# Patient Record
Sex: Male | Born: 1960 | Race: White | Hispanic: No | Marital: Married | Smoking: Never smoker
Health system: Southern US, Community
[De-identification: ages and names within clinical notes are randomized; demographics above are authoritative.]

## PROBLEM LIST (undated history)

## (undated) DIAGNOSIS — I2510 Atherosclerotic heart disease of native coronary artery without angina pectoris: Secondary | ICD-10-CM

## (undated) DIAGNOSIS — E785 Hyperlipidemia, unspecified: Secondary | ICD-10-CM

## (undated) DIAGNOSIS — R519 Headache, unspecified: Secondary | ICD-10-CM

## (undated) DIAGNOSIS — I10 Essential (primary) hypertension: Secondary | ICD-10-CM

## (undated) DIAGNOSIS — M1990 Unspecified osteoarthritis, unspecified site: Secondary | ICD-10-CM

## (undated) DIAGNOSIS — R51 Headache: Secondary | ICD-10-CM

## (undated) HISTORY — PX: KNEE ARTHROSCOPY: SUR90

## (undated) HISTORY — PX: TONSILLECTOMY: SUR1361

---

## 2006-06-01 ENCOUNTER — Encounter: Admission: RE | Admit: 2006-06-01 | Discharge: 2006-06-01 | Payer: Self-pay | Admitting: Orthopedic Surgery

## 2015-12-03 DIAGNOSIS — M1712 Unilateral primary osteoarthritis, left knee: Secondary | ICD-10-CM | POA: Diagnosis not present

## 2015-12-10 DIAGNOSIS — M1712 Unilateral primary osteoarthritis, left knee: Secondary | ICD-10-CM | POA: Diagnosis not present

## 2015-12-11 DIAGNOSIS — E7800 Pure hypercholesterolemia, unspecified: Secondary | ICD-10-CM | POA: Diagnosis not present

## 2015-12-11 DIAGNOSIS — N183 Chronic kidney disease, stage 3 (moderate): Secondary | ICD-10-CM | POA: Diagnosis not present

## 2015-12-11 DIAGNOSIS — R739 Hyperglycemia, unspecified: Secondary | ICD-10-CM | POA: Diagnosis not present

## 2015-12-11 DIAGNOSIS — I10 Essential (primary) hypertension: Secondary | ICD-10-CM | POA: Diagnosis not present

## 2015-12-17 DIAGNOSIS — M1712 Unilateral primary osteoarthritis, left knee: Secondary | ICD-10-CM | POA: Diagnosis not present

## 2015-12-18 DIAGNOSIS — N183 Chronic kidney disease, stage 3 (moderate): Secondary | ICD-10-CM | POA: Diagnosis not present

## 2015-12-18 DIAGNOSIS — M1712 Unilateral primary osteoarthritis, left knee: Secondary | ICD-10-CM | POA: Diagnosis not present

## 2015-12-18 DIAGNOSIS — I10 Essential (primary) hypertension: Secondary | ICD-10-CM | POA: Diagnosis not present

## 2015-12-18 DIAGNOSIS — G43919 Migraine, unspecified, intractable, without status migrainosus: Secondary | ICD-10-CM | POA: Diagnosis not present

## 2016-02-08 DIAGNOSIS — M1712 Unilateral primary osteoarthritis, left knee: Secondary | ICD-10-CM | POA: Diagnosis not present

## 2016-03-08 DIAGNOSIS — M1712 Unilateral primary osteoarthritis, left knee: Secondary | ICD-10-CM | POA: Diagnosis not present

## 2016-03-09 DIAGNOSIS — Z6830 Body mass index (BMI) 30.0-30.9, adult: Secondary | ICD-10-CM | POA: Diagnosis not present

## 2016-03-09 DIAGNOSIS — M1712 Unilateral primary osteoarthritis, left knee: Secondary | ICD-10-CM | POA: Diagnosis not present

## 2016-03-09 DIAGNOSIS — I10 Essential (primary) hypertension: Secondary | ICD-10-CM | POA: Diagnosis not present

## 2016-03-09 DIAGNOSIS — N183 Chronic kidney disease, stage 3 (moderate): Secondary | ICD-10-CM | POA: Diagnosis not present

## 2016-03-14 ENCOUNTER — Ambulatory Visit: Payer: Self-pay | Admitting: Physician Assistant

## 2016-03-14 NOTE — H&P (Signed)
TOTAL KNEE ADMISSION H&P  Patient is being admitted for left total knee arthroplasty.  Subjective:  Chief Complaint:left knee pain.  HPI: Jason [name], 55 y.o. male, has a history of pain and functional disability in the left knee due to arthritis and has failed non-surgical conservative treatments for greater than 12 weeks to includeNSAID's and/or analgesics, corticosteriod injections, viscosupplementation injections and activity modification.  Onset of symptoms was gradual, starting 5 years ago with gradually worsening course since that time. The patient noted no past surgery on the left knee(s).  Patient currently rates pain in the left knee(s) at 8 out of 10 with activity. Patient has night pain, worsening of pain with activity and weight bearing, pain that interferes with activities of daily living, pain with passive range of motion, crepitus and joint swelling.  Patient has evidence of periarticular osteophytes and joint space narrowing by imaging studies.There is no active infection.  There are no active problems to display for this patient.  No past medical history on file.  No past surgical history on file.   (Not in a hospital admission) Allergies not on file  Social History  Substance Use Topics  . Smoking status: Not on file  . Smokeless tobacco: Not on file  . Alcohol use Not on file    No family history on file.   Review of Systems  HENT: Positive for nosebleeds.   Musculoskeletal: Positive for joint pain.  Neurological: Positive for headaches.  All other systems reviewed and are negative.   Objective:  Physical Exam  Constitutional: He is oriented to person, place, and time. He appears well-developed and well-nourished. No distress.  HENT:  Head: Normocephalic and atraumatic.  Nose: Nose normal.  Eyes: Conjunctivae and EOM are normal. Pupils are equal, round, and reactive to light.  Neck: Normal range of motion. Neck supple.  Cardiovascular: Normal rate,  regular rhythm, normal heart sounds and intact distal pulses.   Respiratory: Effort normal and breath sounds normal. No respiratory distress. He has no wheezes.  GI: Soft. Bowel sounds are normal. He exhibits no distension. There is no tenderness.  Musculoskeletal:       Left knee: He exhibits decreased range of motion and swelling. Tenderness found.  Lymphadenopathy:    He has no cervical adenopathy.  Neurological: He is alert and oriented to person, place, and time. No cranial nerve deficit.  Skin: Skin is warm and dry. No rash noted. No erythema.  Psychiatric: He has a normal mood and affect. His behavior is normal.    Vital signs in last 24 hours: @VSRANGES @  Labs:   There is no height or weight on file to calculate BMI.   Imaging Review Plain radiographs demonstrate severe degenerative joint disease of the left knee(s). The overall alignment issignificant varus. The bone quality appears to be good for age and reported activity level.  Assessment/Plan:  End stage arthritis, left knee   The patient history, physical examination, clinical judgment of the provider and imaging studies are consistent with end stage degenerative joint disease of the left knee(s) and total knee arthroplasty is deemed medically necessary. The treatment options including medical management, injection therapy arthroscopy and arthroplasty were discussed at length. The risks and benefits of total knee arthroplasty were presented and reviewed. The risks due to aseptic loosening, infection, stiffness, patella tracking problems, thromboembolic complications and other imponderables were discussed. The patient acknowledged the explanation, agreed to proceed with the plan and consent was signed. Patient is being admitted for inpatient treatment for  surgery, pain control, PT, OT, prophylactic antibiotics, VTE prophylaxis, progressive ambulation and ADL's and discharge planning. The patient is planning to be discharged  home with home health services

## 2016-03-18 NOTE — Pre-Procedure Instructions (Signed)
    Jason [name]  03/18/2016      [facility] [place] [address] [place] [id number] Phone: [phone] Fax: [phone]    Your procedure is scheduled on  Friday, Sept. 1, 2017   [facility] Ad[facility] at 5:30 AM             (posted surgery time 7:30 am - 9:56 am) .  Call this number if you have problems the morning of surgery:  [phone]   Remember:  Do not eat food or drink liquids after midnight Thursday.   Take these medicines the morning of surgery with A SIP OF WATER --Nothing             4-5 days prior to surgery, STOP taking any herbal supplements, Vitamins, anti-inflammatories.   Do not wear jewelry - no rings or watches.  Do not wear lotions, powders, or perfumes.                Men may shave face and neck.  Do not bring valuables to the hospital.  [facility] not [facility] any belongings or valuables.  Contacts, dentures or bridgework may not be worn into surgery.  Leave your suitcase in the car.  After surgery it may be brought to your room.  For patients admitted to the hospital, discharge time will be determined by your treatment team.  Name and phone number of your driver:     Please read over the following fact sheets that you were given. Pain Booklet and MRSA Information

## 2016-03-21 ENCOUNTER — Encounter (HOSPITAL_COMMUNITY)
Admission: RE | Admit: 2016-03-21 | Discharge: 2016-03-21 | Disposition: A | Discharge status: Home / Self Care | Payer: BLUE CROSS/BLUE SHIELD | Source: Ambulatory Visit | Attending: Orthopedic Surgery | Admitting: Orthopedic Surgery

## 2016-03-21 ENCOUNTER — Ambulatory Visit (HOSPITAL_COMMUNITY)
Admission: RE | Admit: 2016-03-21 | Discharge: 2016-03-21 | Disposition: A | Discharge status: Home / Self Care | Payer: BLUE CROSS/BLUE SHIELD | Source: Ambulatory Visit | Attending: Physician Assistant | Admitting: Physician Assistant

## 2016-03-21 ENCOUNTER — Encounter (HOSPITAL_COMMUNITY): Payer: Self-pay

## 2016-03-21 DIAGNOSIS — Z01818 Encounter for other preprocedural examination: Secondary | ICD-10-CM | POA: Diagnosis not present

## 2016-03-21 DIAGNOSIS — M1712 Unilateral primary osteoarthritis, left knee: Secondary | ICD-10-CM

## 2016-03-21 DIAGNOSIS — Z01810 Encounter for preprocedural cardiovascular examination: Secondary | ICD-10-CM | POA: Diagnosis not present

## 2016-03-21 HISTORY — DX: Headache, unspecified: R51.9

## 2016-03-21 HISTORY — DX: Headache: R51

## 2016-03-21 HISTORY — DX: Unspecified osteoarthritis, unspecified site: M19.90

## 2016-03-21 LAB — COMPREHENSIVE METABOLIC PANEL
ALT: 22 U/L (ref 17–63)
AST: 21 U/L (ref 15–41)
Albumin: 4.3 g/dL (ref 3.5–5.0)
Alkaline Phosphatase: 65 U/L (ref 38–126)
Anion gap: 8 (ref 5–15)
BUN: 17 mg/dL (ref 6–20)
CO2: 25 mmol/L (ref 22–32)
Calcium: 9.7 mg/dL (ref 8.9–10.3)
Chloride: 107 mmol/L (ref 101–111)
Creatinine, Ser: 1.15 mg/dL (ref 0.61–1.24)
GFR calc Af Amer: >60 mL/min (ref >60)
GFR calc non Af Amer: >60 mL/min (ref >60)
Glucose, Bld: 93 mg/dL (ref 65–99)
Potassium: 3.9 mmol/L (ref 3.5–5.1)
Sodium: 140 mmol/L (ref 135–145)
Total Bilirubin: 1.7 mg/dL — ABNORMAL HIGH (ref 0.3–1.2)
Total Protein: 7 g/dL (ref 6.5–8.1)

## 2016-03-21 LAB — CBC WITH DIFFERENTIAL/PLATELET
Basophils Absolute: 0 10*3/uL (ref 0.0–0.1)
Basophils Relative: 0 %
Eosinophils Absolute: 0.3 10*3/uL (ref 0.0–0.7)
Eosinophils Relative: 4 %
HCT: 44.5 % (ref 39.0–52.0)
Hemoglobin: 14.9 g/dL (ref 13.0–17.0)
Lymphocytes Relative: 37 %
Lymphs Abs: 2.7 10*3/uL (ref 0.7–4.0)
MCH: 29.4 pg (ref 26.0–34.0)
MCHC: 33.5 g/dL (ref 30.0–36.0)
MCV: 87.9 fL (ref 78.0–100.0)
Monocytes Absolute: 0.5 10*3/uL (ref 0.1–1.0)
Monocytes Relative: 6 %
Neutro Abs: 3.9 10*3/uL (ref 1.7–7.7)
Neutrophils Relative %: 53 %
Platelets: 210 10*3/uL (ref 150–400)
RBC: 5.06 MIL/uL (ref 4.22–5.81)
RDW: 12.2 % (ref 11.5–15.5)
WBC: 7.4 10*3/uL (ref 4.0–10.5)

## 2016-03-21 LAB — URINALYSIS, ROUTINE W REFLEX MICROSCOPIC
Bilirubin Urine: NEGATIVE
Glucose, UA: NEGATIVE mg/dL
Hgb urine dipstick: NEGATIVE
Ketones, ur: NEGATIVE mg/dL
Leukocytes, UA: NEGATIVE
Nitrite: NEGATIVE
Protein, ur: NEGATIVE mg/dL
Specific Gravity, Urine: 1.009 (ref 1.005–1.030)
pH: 6 (ref 5.0–8.0)

## 2016-03-21 LAB — TYPE AND SCREEN
ABO/RH(D): B POS
Antibody Screen: NEGATIVE

## 2016-03-21 LAB — PROTIME-INR
INR: 1.1
Prothrombin Time: 14.3 seconds (ref 11.4–15.2)

## 2016-03-21 LAB — APTT: aPTT: 31 seconds (ref 24–36)

## 2016-03-21 LAB — ABO/RH: ABO/RH(D): B POS

## 2016-03-21 LAB — SURGICAL PCR SCREEN
MRSA, PCR: NEGATIVE
Staphylococcus aureus: NEGATIVE

## 2016-03-22 LAB — URINE CULTURE: Culture: NO GROWTH

## 2016-03-31 MED ORDER — SODIUM CHLORIDE 0.9 % IV SOLN
1000.0000 mg | INTRAVENOUS | Status: AC
  Administered 2016-04-01: 1000 mg via INTRAVENOUS
  Filled 2016-03-31: qty 10

## 2016-03-31 MED ORDER — CEFAZOLIN SODIUM-DEXTROSE 2-4 GM/100ML-% IV SOLN
2.0000 g | INTRAVENOUS | Status: AC
  Administered 2016-04-01: 2 g via INTRAVENOUS
  Filled 2016-03-31: qty 100

## 2016-04-01 ENCOUNTER — Inpatient Hospital Stay (HOSPITAL_COMMUNITY)
Admission: RE | Admit: 2016-04-01 | Discharge: 2016-04-02 | DRG: 470 | Disposition: A | Discharge status: Home Health | Payer: BLUE CROSS/BLUE SHIELD | Source: Ambulatory Visit | Attending: Orthopedic Surgery | Admitting: Orthopedic Surgery

## 2016-04-01 ENCOUNTER — Inpatient Hospital Stay (HOSPITAL_COMMUNITY): Payer: BLUE CROSS/BLUE SHIELD | Admitting: Certified Registered"

## 2016-04-01 ENCOUNTER — Encounter (HOSPITAL_COMMUNITY): Payer: Self-pay | Admitting: *Deleted

## 2016-04-01 ENCOUNTER — Encounter (HOSPITAL_COMMUNITY)
Admission: RE | Disposition: A | Discharge status: Home Health | Payer: Self-pay | Source: Ambulatory Visit | Attending: Orthopedic Surgery

## 2016-04-01 DIAGNOSIS — D62 Acute posthemorrhagic anemia: Secondary | ICD-10-CM | POA: Diagnosis not present

## 2016-04-01 DIAGNOSIS — Z96652 Presence of left artificial knee joint: Secondary | ICD-10-CM | POA: Diagnosis not present

## 2016-04-01 DIAGNOSIS — I10 Essential (primary) hypertension: Secondary | ICD-10-CM | POA: Diagnosis present

## 2016-04-01 DIAGNOSIS — G8918 Other acute postprocedural pain: Secondary | ICD-10-CM | POA: Diagnosis not present

## 2016-04-01 DIAGNOSIS — M1712 Unilateral primary osteoarthritis, left knee: Secondary | ICD-10-CM | POA: Diagnosis not present

## 2016-04-01 DIAGNOSIS — E785 Hyperlipidemia, unspecified: Secondary | ICD-10-CM | POA: Diagnosis present

## 2016-04-01 DIAGNOSIS — M25562 Pain in left knee: Secondary | ICD-10-CM | POA: Diagnosis not present

## 2016-04-01 DIAGNOSIS — M179 Osteoarthritis of knee, unspecified: Secondary | ICD-10-CM | POA: Diagnosis not present

## 2016-04-01 HISTORY — PX: TOTAL KNEE ARTHROPLASTY: SHX125

## 2016-04-01 HISTORY — DX: Essential (primary) hypertension: I10

## 2016-04-01 HISTORY — DX: Hyperlipidemia, unspecified: E78.5

## 2016-04-01 SURGERY — ARTHROPLASTY, KNEE, TOTAL
Anesthesia: Regional | Site: Knee | Laterality: Left

## 2016-04-01 MED ORDER — PHENYLEPHRINE 40 MCG/ML (10ML) SYRINGE FOR IV PUSH (FOR BLOOD PRESSURE SUPPORT)
PREFILLED_SYRINGE | INTRAVENOUS | Status: AC
  Filled 2016-04-01: qty 20

## 2016-04-01 MED ORDER — PHENYLEPHRINE HCL 10 MG/ML IJ SOLN
INTRAMUSCULAR | Status: DC | PRN
  Administered 2016-04-01 (×2): 100 ug via INTRAVENOUS
  Administered 2016-04-01: 40 ug via INTRAVENOUS

## 2016-04-01 MED ORDER — ROPIVACAINE HCL 5 MG/ML IJ SOLN
INTRAMUSCULAR | Status: DC | PRN
  Administered 2016-04-01: 25 mL via PERINEURAL

## 2016-04-01 MED ORDER — NIACIN ER (ANTIHYPERLIPIDEMIC) 500 MG PO TBCR
1000.0000 mg | EXTENDED_RELEASE_TABLET | Freq: Every day | ORAL | Status: DC
  Administered 2016-04-01: 1000 mg via ORAL
  Filled 2016-04-01 (×2): qty 2

## 2016-04-01 MED ORDER — LACTATED RINGERS IV SOLN
INTRAVENOUS | Status: DC | PRN
  Administered 2016-04-01 (×2): via INTRAVENOUS

## 2016-04-01 MED ORDER — PHENYLEPHRINE HCL 10 MG/ML IJ SOLN
INTRAVENOUS | Status: DC | PRN
  Administered 2016-04-01: 30 ug/min via INTRAVENOUS

## 2016-04-01 MED ORDER — OXYCODONE-ACETAMINOPHEN 5-325 MG PO TABS: 1.0000 | ORAL_TABLET | ORAL | 0 refills | Status: DC | PRN

## 2016-04-01 MED ORDER — CEFAZOLIN IN D5W 1 GM/50ML IV SOLN
1.0000 g | Freq: Four times a day (QID) | INTRAVENOUS | Status: AC
  Administered 2016-04-01 (×2): 1 g via INTRAVENOUS
  Filled 2016-04-01 (×2): qty 50

## 2016-04-01 MED ORDER — HYDROMORPHONE HCL 1 MG/ML IJ SOLN: 1.0000 mg | INTRAMUSCULAR | Status: DC | PRN

## 2016-04-01 MED ORDER — BUPIVACAINE-EPINEPHRINE (PF) 0.5% -1:200000 IJ SOLN
INTRAMUSCULAR | Status: AC
  Filled 2016-04-01: qty 30

## 2016-04-01 MED ORDER — SODIUM CHLORIDE 0.9 % IV SOLN: INTRAVENOUS | Status: DC

## 2016-04-01 MED ORDER — PROPOFOL 10 MG/ML IV BOLUS
INTRAVENOUS | Status: AC
  Filled 2016-04-01: qty 20

## 2016-04-01 MED ORDER — POLYETHYLENE GLYCOL 3350 17 G PO PACK: 17.0000 g | PACK | Freq: Every day | ORAL | Status: DC | PRN

## 2016-04-01 MED ORDER — BUPIVACAINE HCL (PF) 0.25 % IJ SOLN
INTRAMUSCULAR | Status: DC | PRN
  Administered 2016-04-01: 50 mL

## 2016-04-01 MED ORDER — HYDROMORPHONE HCL 1 MG/ML IJ SOLN: 0.2500 mg | INTRAMUSCULAR | Status: DC | PRN

## 2016-04-01 MED ORDER — MENTHOL 3 MG MT LOZG: 1.0000 | LOZENGE | OROMUCOSAL | Status: DC | PRN

## 2016-04-01 MED ORDER — PROPOFOL 10 MG/ML IV BOLUS
INTRAVENOUS | Status: DC | PRN
  Administered 2016-04-01: 40 mg via INTRAVENOUS
  Administered 2016-04-01 (×2): 20 mg via INTRAVENOUS

## 2016-04-01 MED ORDER — LIDOCAINE 2% (20 MG/ML) 5 ML SYRINGE
INTRAMUSCULAR | Status: DC | PRN
  Administered 2016-04-01 (×3): 10 mg via INTRAVENOUS

## 2016-04-01 MED ORDER — ACETAMINOPHEN 325 MG PO TABS
650.0000 mg | ORAL_TABLET | Freq: Four times a day (QID) | ORAL | Status: DC | PRN
  Administered 2016-04-02 (×2): 650 mg via ORAL
  Filled 2016-04-01 (×2): qty 2

## 2016-04-01 MED ORDER — CHLORHEXIDINE GLUCONATE 4 % EX LIQD: 60.0000 mL | Freq: Once | CUTANEOUS | Status: DC

## 2016-04-01 MED ORDER — SODIUM CHLORIDE 0.9% FLUSH
INTRAVENOUS | Status: DC | PRN
  Administered 2016-04-01: 50 mL

## 2016-04-01 MED ORDER — PROPOFOL 1000 MG/100ML IV EMUL
INTRAVENOUS | Status: AC
  Filled 2016-04-01: qty 300

## 2016-04-01 MED ORDER — MIDAZOLAM HCL 5 MG/5ML IJ SOLN
INTRAMUSCULAR | Status: DC | PRN
  Administered 2016-04-01: 2 mg via INTRAVENOUS

## 2016-04-01 MED ORDER — FLEET ENEMA 7-19 GM/118ML RE ENEM: 1.0000 | ENEMA | Freq: Once | RECTAL | Status: DC | PRN

## 2016-04-01 MED ORDER — METOCLOPRAMIDE HCL 5 MG PO TABS: 5.0000 mg | ORAL_TABLET | Freq: Three times a day (TID) | ORAL | Status: DC | PRN

## 2016-04-01 MED ORDER — PHENOL 1.4 % MT LIQD
1.0000 | OROMUCOSAL | Status: DC | PRN
Start: 2016-04-01 — End: 2016-04-02

## 2016-04-01 MED ORDER — PHENYLEPHRINE 40 MCG/ML (10ML) SYRINGE FOR IV PUSH (FOR BLOOD PRESSURE SUPPORT)
PREFILLED_SYRINGE | INTRAVENOUS | Status: DC | PRN
  Administered 2016-04-01: 120 ug via INTRAVENOUS
  Administered 2016-04-01: 80 ug via INTRAVENOUS
  Administered 2016-04-01: 40 ug via INTRAVENOUS
  Administered 2016-04-01 (×3): 80 ug via INTRAVENOUS
  Administered 2016-04-01: 120 ug via INTRAVENOUS

## 2016-04-01 MED ORDER — BUPIVACAINE LIPOSOME 1.3 % IJ SUSP
20.0000 mL | INTRAMUSCULAR | Status: AC
  Administered 2016-04-01: 20 mL
  Filled 2016-04-01: qty 20

## 2016-04-01 MED ORDER — ONDANSETRON HCL 4 MG/2ML IJ SOLN
INTRAMUSCULAR | Status: DC | PRN
  Administered 2016-04-01: 4 mg via INTRAVENOUS

## 2016-04-01 MED ORDER — DOCUSATE SODIUM 100 MG PO CAPS
100.0000 mg | ORAL_CAPSULE | Freq: Two times a day (BID) | ORAL | Status: DC
  Administered 2016-04-01 – 2016-04-02 (×3): 100 mg via ORAL
  Filled 2016-04-01 (×3): qty 1

## 2016-04-01 MED ORDER — MIDAZOLAM HCL 2 MG/2ML IJ SOLN
INTRAMUSCULAR | Status: AC
  Filled 2016-04-01: qty 2

## 2016-04-01 MED ORDER — ONDANSETRON HCL 4 MG PO TABS: 4.0000 mg | ORAL_TABLET | Freq: Four times a day (QID) | ORAL | Status: DC | PRN

## 2016-04-01 MED ORDER — METOCLOPRAMIDE HCL 5 MG/ML IJ SOLN: 5.0000 mg | Freq: Three times a day (TID) | INTRAMUSCULAR | Status: DC | PRN

## 2016-04-01 MED ORDER — PROMETHAZINE HCL 25 MG/ML IJ SOLN: 6.2500 mg | INTRAMUSCULAR | Status: DC | PRN

## 2016-04-01 MED ORDER — BUPIVACAINE IN DEXTROSE 0.75-8.25 % IT SOLN
INTRATHECAL | Status: DC | PRN
  Administered 2016-04-01: 2 mL via INTRATHECAL

## 2016-04-01 MED ORDER — APIXABAN 2.5 MG PO TABS
2.5000 mg | ORAL_TABLET | Freq: Two times a day (BID) | ORAL | Status: DC
  Administered 2016-04-02: 2.5 mg via ORAL
  Filled 2016-04-01: qty 1

## 2016-04-01 MED ORDER — SODIUM CHLORIDE 0.9 % IR SOLN
Status: DC | PRN
  Administered 2016-04-01: 3000 mL

## 2016-04-01 MED ORDER — DIPHENHYDRAMINE HCL 12.5 MG/5ML PO ELIX: 12.5000 mg | ORAL_SOLUTION | ORAL | Status: DC | PRN

## 2016-04-01 MED ORDER — LOSARTAN POTASSIUM 50 MG PO TABS
100.0000 mg | ORAL_TABLET | Freq: Every day | ORAL | Status: DC
  Administered 2016-04-02: 100 mg via ORAL
  Filled 2016-04-01: qty 2

## 2016-04-01 MED ORDER — ACETAMINOPHEN 650 MG RE SUPP: 650.0000 mg | Freq: Four times a day (QID) | RECTAL | Status: DC | PRN

## 2016-04-01 MED ORDER — HYDROCODONE-ACETAMINOPHEN 7.5-325 MG PO TABS: 1.0000 | ORAL_TABLET | Freq: Once | ORAL | Status: DC | PRN

## 2016-04-01 MED ORDER — APIXABAN 2.5 MG PO TABS: 2.5000 mg | ORAL_TABLET | Freq: Two times a day (BID) | ORAL | 0 refills | Status: DC

## 2016-04-01 MED ORDER — TRANEXAMIC ACID 1000 MG/10ML IV SOLN
1000.0000 mg | Freq: Once | INTRAVENOUS | Status: AC
  Administered 2016-04-01: 1000 mg via INTRAVENOUS
  Filled 2016-04-01: qty 10

## 2016-04-01 MED ORDER — OXYCODONE HCL 5 MG PO TABS
5.0000 mg | ORAL_TABLET | ORAL | Status: DC | PRN
  Administered 2016-04-02 (×2): 5 mg via ORAL
  Filled 2016-04-01 (×2): qty 1

## 2016-04-01 MED ORDER — LIDOCAINE 2% (20 MG/ML) 5 ML SYRINGE
INTRAMUSCULAR | Status: AC
  Filled 2016-04-01: qty 5

## 2016-04-01 MED ORDER — FENTANYL CITRATE (PF) 100 MCG/2ML IJ SOLN
INTRAMUSCULAR | Status: AC
  Filled 2016-04-01: qty 2

## 2016-04-01 MED ORDER — SODIUM CHLORIDE 0.9 % IV SOLN
2000.0000 mg | INTRAVENOUS | Status: DC
  Filled 2016-04-01: qty 20

## 2016-04-01 MED ORDER — 0.9 % SODIUM CHLORIDE (POUR BTL) OPTIME
TOPICAL | Status: DC | PRN
  Administered 2016-04-01: 1000 mL

## 2016-04-01 MED ORDER — PROPOFOL 500 MG/50ML IV EMUL
INTRAVENOUS | Status: DC | PRN
  Administered 2016-04-01: 100 ug/kg/min via INTRAVENOUS

## 2016-04-01 MED ORDER — FENTANYL CITRATE (PF) 100 MCG/2ML IJ SOLN
INTRAMUSCULAR | Status: DC | PRN
  Administered 2016-04-01 (×2): 50 ug via INTRAVENOUS

## 2016-04-01 MED ORDER — SODIUM CHLORIDE 0.9 % IV SOLN
INTRAVENOUS | Status: DC
  Administered 2016-04-01: 17:00:00 via INTRAVENOUS

## 2016-04-01 MED ORDER — BUPIVACAINE-EPINEPHRINE (PF) 0.25% -1:200000 IJ SOLN
INTRAMUSCULAR | Status: AC
  Filled 2016-04-01: qty 60

## 2016-04-01 MED ORDER — ONDANSETRON HCL 4 MG/2ML IJ SOLN
4.0000 mg | Freq: Four times a day (QID) | INTRAMUSCULAR | Status: DC | PRN
  Administered 2016-04-01: 4 mg via INTRAVENOUS
  Filled 2016-04-01: qty 2

## 2016-04-01 MED ORDER — SORBITOL 70 % SOLN: 30.0000 mL | Freq: Every day | Status: DC | PRN

## 2016-04-01 MED ORDER — ONDANSETRON HCL 4 MG/2ML IJ SOLN
INTRAMUSCULAR | Status: AC
  Filled 2016-04-01: qty 2

## 2016-04-01 SURGICAL SUPPLY — 62 items
BANDAGE ACE 4X5 VEL STRL LF (GAUZE/BANDAGES/DRESSINGS) ×2 IMPLANT
BANDAGE ACE 6X5 VEL STRL LF (GAUZE/BANDAGES/DRESSINGS) ×2 IMPLANT
BANDAGE ESMARK 6X9 LF (GAUZE/BANDAGES/DRESSINGS) ×1 IMPLANT
BLADE SAGITTAL 25.0X1.19X90 (BLADE) ×2 IMPLANT
BLADE SAW SAG 90X13X1.27 (BLADE) ×2 IMPLANT
BNDG ESMARK 6X9 LF (GAUZE/BANDAGES/DRESSINGS) ×2
BOWL SMART MIX CTS (DISPOSABLE) ×2 IMPLANT
CAP KNEE TOTAL 3 SIGMA ×2 IMPLANT
CEMENT HV SMART SET (Cement) ×4 IMPLANT
COVER SURGICAL LIGHT HANDLE (MISCELLANEOUS) ×2 IMPLANT
CUFF TOURNIQUET SINGLE 34IN LL (TOURNIQUET CUFF) ×2 IMPLANT
CUFF TOURNIQUET SINGLE 44IN (TOURNIQUET CUFF) IMPLANT
DRAPE HALF SHEET 40X57 (DRAPES) ×2 IMPLANT
DRAPE INCISE IOBAN 66X45 STRL (DRAPES) IMPLANT
DRAPE ORTHO SPLIT 77X108 STRL (DRAPES) ×2
DRAPE SURG ORHT 6 SPLT 77X108 (DRAPES) ×2 IMPLANT
DRAPE U-SHAPE 47X51 STRL (DRAPES) ×2 IMPLANT
DRSG ADAPTIC 3X8 NADH LF (GAUZE/BANDAGES/DRESSINGS) ×2 IMPLANT
DRSG PAD ABDOMINAL 8X10 ST (GAUZE/BANDAGES/DRESSINGS) ×2 IMPLANT
DURAPREP 26ML APPLICATOR (WOUND CARE) ×2 IMPLANT
ELECT REM PT RETURN 9FT ADLT (ELECTROSURGICAL) ×2
ELECTRODE REM PT RTRN 9FT ADLT (ELECTROSURGICAL) ×1 IMPLANT
EVACUATOR 1/8 PVC DRAIN (DRAIN) IMPLANT
FACESHIELD WRAPAROUND (MASK) ×4 IMPLANT
FLOSEAL 10ML (HEMOSTASIS) IMPLANT
GAUZE SPONGE 4X4 12PLY STRL (GAUZE/BANDAGES/DRESSINGS) ×2 IMPLANT
GLOVE BIOGEL PI IND STRL 8 (GLOVE) ×4 IMPLANT
GLOVE BIOGEL PI INDICATOR 8 (GLOVE) ×4
GLOVE ORTHO TXT STRL SZ7.5 (GLOVE) ×2 IMPLANT
GLOVE SURG ORTHO 8.0 STRL STRW (GLOVE) ×2 IMPLANT
GOWN STRL REUS W/ TWL LRG LVL3 (GOWN DISPOSABLE) ×2 IMPLANT
GOWN STRL REUS W/ TWL XL LVL3 (GOWN DISPOSABLE) ×1 IMPLANT
GOWN STRL REUS W/TWL 2XL LVL3 (GOWN DISPOSABLE) ×2 IMPLANT
GOWN STRL REUS W/TWL LRG LVL3 (GOWN DISPOSABLE) ×2
GOWN STRL REUS W/TWL XL LVL3 (GOWN DISPOSABLE) ×1
HANDPIECE INTERPULSE COAX TIP (DISPOSABLE) ×1
HOOD PEEL AWAY FACE SHEILD DIS (HOOD) ×2 IMPLANT
IMMOBILIZER KNEE 22 UNIV (SOFTGOODS) IMPLANT
KIT BASIN OR (CUSTOM PROCEDURE TRAY) ×2 IMPLANT
KIT ROOM TURNOVER OR (KITS) ×2 IMPLANT
MANIFOLD NEPTUNE II (INSTRUMENTS) ×2 IMPLANT
NEEDLE 22X1 1/2 (OR ONLY) (NEEDLE) ×4 IMPLANT
NS IRRIG 1000ML POUR BTL (IV SOLUTION) ×2 IMPLANT
PACK TOTAL JOINT (CUSTOM PROCEDURE TRAY) ×2 IMPLANT
PAD ARMBOARD 7.5X6 YLW CONV (MISCELLANEOUS) ×4 IMPLANT
PAD CAST 4YDX4 CTTN HI CHSV (CAST SUPPLIES) ×1 IMPLANT
PADDING CAST COTTON 4X4 STRL (CAST SUPPLIES) ×1
PADDING CAST COTTON 6X4 STRL (CAST SUPPLIES) ×2 IMPLANT
SET HNDPC FAN SPRY TIP SCT (DISPOSABLE) ×1 IMPLANT
STAPLER VISISTAT 35W (STAPLE) ×2 IMPLANT
SUCTION FRAZIER HANDLE 10FR (MISCELLANEOUS) ×1
SUCTION TUBE FRAZIER 10FR DISP (MISCELLANEOUS) ×1 IMPLANT
SUT ETHIBOND NAB CT1 #1 30IN (SUTURE) ×6 IMPLANT
SUT VIC AB 0 CT1 27 (SUTURE) ×1
SUT VIC AB 0 CT1 27XBRD ANBCTR (SUTURE) ×1 IMPLANT
SUT VIC AB 2-0 CT1 27 (SUTURE) ×2
SUT VIC AB 2-0 CT1 TAPERPNT 27 (SUTURE) ×2 IMPLANT
SYR CONTROL 10ML LL (SYRINGE) ×4 IMPLANT
TOWEL OR 17X24 6PK STRL BLUE (TOWEL DISPOSABLE) ×2 IMPLANT
TOWEL OR 17X26 10 PK STRL BLUE (TOWEL DISPOSABLE) ×2 IMPLANT
TRAY FOLEY CATH 16FRSI W/METER (SET/KITS/TRAYS/PACK) IMPLANT
WATER STERILE IRR 1000ML POUR (IV SOLUTION) ×2 IMPLANT

## 2016-04-01 NOTE — Progress Notes (Signed)
Orthopedic Tech Progress Note Patient Details:  Jason [name] 1961/02/18 [id number]  CPM Left Knee CPM Left Knee: On Left Knee Flexion (Degrees): 90 Left Knee Extension (Degrees): 0 Additional Comments: Trapeze bar and foot roll   Saul FordyceJennifer C Lennon Boutwell 04/01/2016, 3:01 PM

## 2016-04-01 NOTE — Anesthesia Procedure Notes (Signed)
Spinal  Staffing Anesthesiologist: Sharlon Pfohl Performed: anesthesiologist  Preanesthetic Checklist Completed: patient identified, site marked, surgical consent, pre-op evaluation, timeout performed, IV checked, risks and benefits discussed and monitors and equipment checked Spinal Block Patient position: sitting Prep: ChloraPrep Patient monitoring: heart rate, cardiac monitor, continuous pulse ox and blood pressure Approach: midline Location: L3-4 Injection technique: single-shot Needle Needle type: Pencan  Needle gauge: 24 G Assessment Sensory level: T6 Additional Notes Functioning IV was confirmed and monitors were applied. Sterile prep and drape, including hand hygiene, mask and sterile gloves were used. The patient was positioned and the spine was prepped. The skin was anesthetized with lidocaine.  Free flow of clear CSF was obtained prior to injecting local anesthetic into the CSF.  The spinal needle aspirated freely following injection.  The needle was carefully withdrawn.  The patient tolerated the procedure well. Consent was obtained prior to procedure with all questions answered and concerns addressed.  Ben Laporche Martelle, MD     

## 2016-04-01 NOTE — Progress Notes (Signed)
Orthopedic Tech Progress Note Patient Details:  Jason [name] [birth year] [id number]  CPM Left Knee CPM Left Knee: On Left Knee Flexion (Degrees): 90 Left Knee Extension (Degrees): 0 Additional Comments: Trapeze bar and foot roll   Saul FordyceJennifer C Garik Diamant 04/01/2016, 10:23 AM

## 2016-04-01 NOTE — Interval H&P Note (Signed)
History and Physical Interval Note:  04/01/2016 7:27 AM  Jason [name]  has presented today for surgery, with the diagnosis of OA LEFT KNEE  The various methods of treatment have been discussed with the patient and family. After consideration of risks, benefits and other options for treatment, the patient has consented to  Procedure(s): TOTAL KNEE ARTHROPLASTY (Left) as a surgical intervention .  The patient's history has been reviewed, patient examined, no change in status, stable for surgery.  I have reviewed the patient's chart and labs.  Questions were answered to the patient's satisfaction.     Mally Gavina JR,W D

## 2016-04-01 NOTE — Transfer of Care (Signed)
Immediate Anesthesia Transfer of Care Note  Patient: Jason [name]  Procedure(s) Performed: Procedure(s): TOTAL KNEE ARTHROPLASTY (Left)  Patient Location: PACU  Anesthesia Type:Spinal  Level of Consciousness:  sedated, patient cooperative and responds to stimulation  Airway & Oxygen Therapy:Patient Spontanous Breathing and Patient connected to face mask oxgen  Post-op Assessment:  Report given to PACU RN and Post -op Vital signs reviewed and stable  Post vital signs:  Reviewed and stable  Last Vitals:  Vitals:   04/01/16 0630  BP: (!) 181/98  Pulse: 78  Resp: 20  Temp: 36.8 C    Complications: No apparent anesthesia complications

## 2016-04-01 NOTE — Progress Notes (Signed)
Patient encouraged to use bone foam as ordered.  Patient verbalized that "I just can't deal with it, I don't want it".  Patient reports that pain currently is a 3 out of 10 on a 0-10 scale.  Educated patient on the purpose of the bone foam and patient verbalized understanding.  Nursing will continue to monitor.  Octavia BrucknerAmanda Teeters RN

## 2016-04-01 NOTE — Brief Op Note (Signed)
04/01/2016  9:43 AM  PATIENT:  Jason [name]  55 y.o. male  PRE-OPERATIVE DIAGNOSIS:  OA LEFT KNEE  POST-OPERATIVE DIAGNOSIS:  OA LEFT KNEE  PROCEDURE:  Procedure(s): TOTAL KNEE ARTHROPLASTY (Left)  SURGEON:  Surgeon(s) and Role:    * Frederico Hammananiel Caffrey, MD - Primary  PHYSICIAN ASSISTANT: Margart SicklesJoshua Corbitt Cloke, PA-C  ASSISTANTS: OR staff x1   ANESTHESIA:   local, regional, spinal and IV sedation  EBL:  Total I/O In: 1000 [I.V.:1000] Out: 75 [Blood:75]  BLOOD ADMINISTERED:none  DRAINS: none   LOCAL MEDICATIONS USED:  MARCAINE     SPECIMEN:  No Specimen  DISPOSITION OF SPECIMEN:  N/A  COUNTS:  YES  TOURNIQUET:   Total Tourniquet Time Documented: Thigh (Left) - 63 minutes Total: Thigh (Left) - 63 minutes   DICTATION: .Other Dictation: Dictation Number unknown  PLAN OF CARE: Admit to inpatient   PATIENT DISPOSITION:  PACU - hemodynamically stable.   Delay start of Pharmacological VTE agent (>24hrs) due to surgical blood loss or risk of bleeding: yes

## 2016-04-01 NOTE — Evaluation (Signed)
Physical Therapy Evaluation Patient Details Name: Jason PedroRussell Acebo MRN: [id number] DOB: Jun 16, 1961 Today's Date: 04/01/2016   History of Present Illness  Pt is a 55 y/o male s/p L TKA. No pertinent PMH.  Clinical Impression  Pt presented supine in bed with HOB elevated, L LE in CPM, awake and willing to participate in therapy session. Pt's wife was present throughout session as well. Prior to admission, pt was independent with all functional mobility. Pt moving well throughout evaluation and did not require any physical assist, just min guard at most for safety. Pt would continue to benefit from skilled physical therapy services at this time while admitted and after d/c to address his below listed limitations in order to improve his overall safety and independence with functional mobility. PT plan to complete stair training with pt at next session.     Follow Up Recommendations Home health PT;Supervision for mobility/OOB    Equipment Recommendations  None recommended by PT;Other (comment) (pt reported having all necessary DME at home)    Recommendations for Other Services       Precautions / Restrictions Precautions Precautions: Fall;Knee Precaution Comments: PT reviewed LE positioning following TKA surgery with pt. Required Braces or Orthoses: Knee Immobilizer - Left Knee Immobilizer - Left: Discontinue once straight leg raise with < 10 degree lag Restrictions Weight Bearing Restrictions: Yes LLE Weight Bearing: Weight bearing as tolerated      Mobility  Bed Mobility Overal bed mobility: Needs Assistance Bed Mobility: Supine to Sit;Sit to Supine     Supine to sit: Min guard;HOB elevated Sit to supine: Min guard   General bed mobility comments: pt required increased time and use of bed rails  Transfers Overall transfer level: Needs assistance Equipment used: Rolling walker (2 wheeled) Transfers: Sit to/from Stand Sit to Stand: Min guard         General transfer  comment: pt required increased time and VC'ing for bilateral hand positioning  Ambulation/Gait Ambulation/Gait assistance: Min guard Ambulation Distance (Feet): 100 Feet Assistive device: Rolling walker (2 wheeled) Gait Pattern/deviations: Step-to pattern;Decreased step length - right;Decreased stance time - left;Decreased weight shift to left Gait velocity: decreased Gait velocity interpretation: Below normal speed for age/gender General Gait Details: pt required VC'ing for sequencing with RW  Stairs            Wheelchair Mobility    Modified Rankin (Stroke Patients Only)       Balance Overall balance assessment: Needs assistance Sitting-balance support: Feet supported;No upper extremity supported Sitting balance-Leahy Scale: Good     Standing balance support: During functional activity;Bilateral upper extremity supported Standing balance-Leahy Scale: Poor                               Pertinent Vitals/Pain Pain Assessment: 0-10 Pain Score: 3  Pain Location: L knee Pain Descriptors / Indicators: Guarding;Sore Pain Intervention(s): Monitored during session;Repositioned    Home Living Family/patient expects to be discharged to:: Private residence Living Arrangements: Spouse/significant other Available Help at Discharge: Family;Available 24 hours/day Type of Home: House Home Access: Stairs to enter Entrance Stairs-Rails: None Entrance Stairs-Number of Steps: 3 Home Layout: One level Home Equipment: Walker - 2 wheels;Cane - single point;Bedside commode;Shower seat;Shower seat - built in      Prior Function Level of Independence: Independent               Hand Dominance        Extremity/Trunk Assessment  Upper Extremity Assessment: Defer to OT evaluation           Lower Extremity Assessment: LLE deficits/detail   LLE Deficits / Details: Pt with decreased strength and ROM limitations secondary to post-op. Sensation grossly  intact.  Cervical / Trunk Assessment: Normal  Communication   Communication: No difficulties  Cognition Arousal/Alertness: Awake/alert Behavior During Therapy: WFL for tasks assessed/performed Overall Cognitive Status: Within Functional Limits for tasks assessed                      General Comments      Exercises Total Joint Exercises Long Arc Quad: AROM;Strengthening;Left;10 reps;Seated Knee Flexion: AROM;Strengthening;Left;20 reps;Seated      Assessment/Plan    PT Assessment Patient needs continued PT services  PT Diagnosis Difficulty walking;Acute pain   PT Problem List Decreased strength;Decreased range of motion;Decreased activity tolerance;Decreased balance;Decreased mobility;Decreased coordination;Decreased knowledge of use of DME;Pain  PT Treatment Interventions DME instruction;Gait training;Stair training;Functional mobility training;Therapeutic activities;Therapeutic exercise;Balance training;Neuromuscular re-education;Patient/family education   PT Goals (Current goals can be found in the Care Plan section) Acute Rehab PT Goals Patient Stated Goal: return home PT Goal Formulation: With patient/family Time For Goal Achievement: 04/08/16 Potential to Achieve Goals: Good    Frequency 7X/week   Barriers to discharge        Co-evaluation               End of Session Equipment Utilized During Treatment: Gait belt;Other (comment) (pt able to complete SLR x10 so immobilizer was not needed) Activity Tolerance: Patient limited by fatigue Patient left: in bed;in CPM;with call bell/phone within reach;with family/visitor present Nurse Communication: Mobility status         Time: 1610-9604 PT Time Calculation (min) (ACUTE ONLY): 31 min   Charges:   PT Evaluation $PT Eval Low Complexity: 1 Procedure PT Treatments $Gait Training: 8-22 mins   PT G CodesAlessandra Bevels Toneka Fullen 04/01/2016, 5:56 PM Deborah Chalk, PT, DPT [phone]

## 2016-04-01 NOTE — Discharge Instructions (Signed)
INSTRUCTIONS AFTER JOINT REPLACEMENT  ° °o Remove items at home which could result in a fall. This includes throw rugs or furniture in walking pathways °o ICE to the affected joint every three hours while awake for 30 minutes at a time, for at least the first 3-5 days, and then as needed for pain and swelling.  Continue to use ice for pain and swelling. You may notice swelling that will progress down to the foot and ankle.  This is normal after surgery.  Elevate your leg when you are not up walking on it.   °o Continue to use the breathing machine you got in the hospital (incentive spirometer) which will help keep your temperature down.  It is common for your temperature to cycle up and down following surgery, especially at night when you are not up moving around and exerting yourself.  The breathing machine keeps your lungs expanded and your temperature down. ° ° °DIET:  As you were doing prior to hospitalization, we recommend a well-balanced diet. ° °DRESSING / WOUND CARE / SHOWERING ° °You may change your dressing 3-5 days after surgery.  Then change the dressing every day with sterile gauze.  Please use good hand washing techniques before changing the dressing.  Do not use any lotions or creams on the incision until instructed by your surgeon. ° °ACTIVITY ° °o Increase activity slowly as tolerated, but follow the weight bearing instructions below.   °o No driving for 6 weeks or until further direction given by your physician.  You cannot drive while taking narcotics.  °o No lifting or carrying greater than 10 lbs. until further directed by your surgeon. °o Avoid periods of inactivity such as sitting longer than an hour when not asleep. This helps prevent blood clots.  °o You may return to work once you are authorized by your doctor.  ° ° ° °WEIGHT BEARING  ° °Weight bearing as tolerated with assist device (walker, cane, etc) as directed, use it as long as suggested by your surgeon or therapist, typically at  least 4-6 weeks. ° ° °EXERCISES ° °Results after joint replacement surgery are often greatly improved when you follow the exercise, range of motion and muscle strengthening exercises prescribed by your doctor. Safety measures are also important to protect the joint from further injury. Any time any of these exercises cause you to have increased pain or swelling, decrease what you are doing until you are comfortable again and then slowly increase them. If you have problems or questions, call your caregiver or physical therapist for advice.  ° °Rehabilitation is important following a joint replacement. After just a few days of immobilization, the muscles of the leg can become weakened and shrink (atrophy).  These exercises are designed to build up the tone and strength of the thigh and leg muscles and to improve motion. Often times heat used for twenty to thirty minutes before working out will loosen up your tissues and help with improving the range of motion but do not use heat for the first two weeks following surgery (sometimes heat can increase post-operative swelling).  ° °These exercises can be done on a training (exercise) mat, on the floor, on a table or on a bed. Use whatever works the best and is most comfortable for you.    Use music or television while you are exercising so that the exercises are a pleasant break in your day. This will make your life better with the exercises acting as a break   in your routine that you can look forward to.   Perform all exercises about fifteen times, three times per day or as directed.  You should exercise both the operative leg and the other leg as well. ° °Exercises include: °  °• Quad Sets - Tighten up the muscle on the front of the thigh (Quad) and hold for 5-10 seconds.   °• Straight Leg Raises - With your knee straight (if you were given a brace, keep it on), lift the leg to 60 degrees, hold for 3 seconds, and slowly lower the leg.  Perform this exercise against  resistance later as your leg gets stronger.  °• Leg Slides: Lying on your back, slowly slide your foot toward your buttocks, bending your knee up off the floor (only go as far as is comfortable). Then slowly slide your foot back down until your leg is flat on the floor again.  °• Angel Wings: Lying on your back spread your legs to the side as far apart as you can without causing discomfort.  °• Hamstring Strength:  Lying on your back, push your heel against the floor with your leg straight by tightening up the muscles of your buttocks.  Repeat, but this time bend your knee to a comfortable angle, and push your heel against the floor.  You may put a pillow under the heel to make it more comfortable if necessary.  ° °A rehabilitation program following joint replacement surgery can speed recovery and prevent re-injury in the future due to weakened muscles. Contact your doctor or a physical therapist for more information on knee rehabilitation.  ° ° °CONSTIPATION ° °Constipation is defined medically as fewer than three stools per week and severe constipation as less than one stool per week.  Even if you have a regular bowel pattern at home, your normal regimen is likely to be disrupted due to multiple reasons following surgery.  Combination of anesthesia, postoperative narcotics, change in appetite and fluid intake all can affect your bowels.  ° °YOU MUST use at least one of the following options; they are listed in order of increasing strength to get the job done.  They are all available over the counter, and you may need to use some, POSSIBLY even all of these options:   ° °Drink plenty of fluids (prune juice may be helpful) and high fiber foods °Colace 100 mg by mouth twice a day  °Senokot for constipation as directed and as needed Dulcolax (bisacodyl), take with full glass of water  °Miralax (polyethylene glycol) once or twice a day as needed. ° °If you have tried all these things and are unable to have a bowel  movement in the first 3-4 days after surgery call either your surgeon or your primary doctor.   ° °If you experience loose stools or diarrhea, hold the medications until you stool forms back up.  If your symptoms do not get better within 1 week or if they get worse, check with your doctor.  If you experience "the worst abdominal pain ever" or develop nausea or vomiting, please contact the office immediately for further recommendations for treatment. ° ° °ITCHING:  If you experience itching with your medications, try taking only a single pain pill, or even half a pain pill at a time.  You can also use Benadryl over the counter for itching or also to help with sleep.  ° °TED HOSE STOCKINGS:  Use stockings on both legs until for at least 2 weeks or as   directed by physician office. They may be removed at night for sleeping. ° °MEDICATIONS:  See your medication summary on the “After Visit Summary” that nursing will review with you.  You may have some home medications which will be placed on hold until you complete the course of blood thinner medication.  It is important for you to complete the blood thinner medication as prescribed. ° °PRECAUTIONS:  If you experience chest pain or shortness of breath - call 911 immediately for transfer to the hospital emergency department.  ° °If you develop a fever greater that 101 F, purulent drainage from wound, increased redness or drainage from wound, foul odor from the wound/dressing, or calf pain - CONTACT YOUR SURGEON.   °                                                °FOLLOW-UP APPOINTMENTS:  If you do not already have a post-op appointment, please call the office for an appointment to be seen by your surgeon.  Guidelines for how soon to be seen are listed in your “After Visit Summary”, but are typically between 1-4 weeks after surgery. ° °OTHER INSTRUCTIONS:  ° °Knee Replacement:  Do not place pillow under knee, focus on keeping the knee straight while resting. CPM  instructions: 0-90 degrees, 2 hours in the morning, 2 hours in the afternoon, and 2 hours in the evening. Place foam block, curve side up under heel at all times except when in CPM or when walking.  DO NOT modify, tear, cut, or change the foam block in any way. ° °MAKE SURE YOU:  °• Understand these instructions.  °• Get help right away if you are not doing well or get worse.  ° ° °Thank you for letting us be a part of your medical care team.  It is a privilege we respect greatly.  We hope these instructions will help you stay on track for a fast and full recovery!  ° °Information on my medicine - ELIQUIS® (apixaban) ° °This medication education was reviewed with me or my healthcare representative as part of my discharge preparation.  ° °Why was Eliquis® prescribed for you? °Eliquis® was prescribed for you to reduce the risk of blood clots forming after orthopedic surgery.   ° °What do You need to know about Eliquis®? °Take your Eliquis® TWICE DAILY - one tablet in the morning and one tablet in the evening with or without food.  It would be best to take the dose about the same time each day. ° °If you have difficulty swallowing the tablet whole please discuss with your pharmacist how to take the medication safely. ° °Take Eliquis® exactly as prescribed by your doctor and DO NOT stop taking Eliquis® without talking to the doctor who prescribed the medication.  Stopping without other medication to take the place of Eliquis® may increase your risk of developing a clot. ° °After discharge, you should have regular check-up appointments with your healthcare provider that is prescribing your Eliquis®. ° °What do you do if you miss a dose? °If a dose of ELIQUIS® is not taken at the scheduled time, take it as soon as possible on the same day and twice-daily administration should be resumed.  The dose should not be doubled to make up for a missed dose.  Do not take more than one tablet of   ELIQUIS at the same  time. ° °Important Safety Information °A possible side effect of Eliquis® is bleeding. You should call your healthcare provider right away if you experience any of the following: °? Bleeding from an injury or your nose that does not stop. °? Unusual colored urine (red or dark brown) or unusual colored stools (red or black). °? Unusual bruising for unknown reasons. °? A serious fall or if you hit your head (even if there is no bleeding). ° °Some medicines may interact with Eliquis® and might increase your risk of bleeding or clotting while on Eliquis®. To help avoid this, consult your healthcare provider or pharmacist prior to using any new prescription or non-prescription medications, including herbals, vitamins, non-steroidal anti-inflammatory drugs (NSAIDs) and supplements. ° °This website has more information on Eliquis® (apixaban): http://www.eliquis.com/eliquis/home ° °

## 2016-04-01 NOTE — Anesthesia Preprocedure Evaluation (Signed)
Anesthesia Evaluation  Patient identified by MRN, date of birth, ID band Patient awake    Reviewed: Allergy & Precautions, H&P , NPO status , Patient's Chart, lab work & pertinent test results  History of Anesthesia Complications Negative for: history of anesthetic complications  Airway Mallampati: II  TM Distance: >3 FB Neck ROM: full    Dental no notable dental hx.    Pulmonary neg pulmonary ROS,    Pulmonary exam normal breath sounds clear to auscultation       Cardiovascular negative cardio ROS Normal cardiovascular exam Rhythm:regular Rate:Normal     Neuro/Psych  Headaches,    GI/Hepatic negative GI ROS, Neg liver ROS,   Endo/Other  negative endocrine ROS  Renal/GU negative Renal ROS     Musculoskeletal  (+) Arthritis ,   Abdominal   Peds  Hematology negative hematology ROS (+)   Anesthesia Other Findings   Reproductive/Obstetrics negative OB ROS                             Anesthesia Physical Anesthesia Plan  ASA: II  Anesthesia Plan: Regional and Spinal   Post-op Pain Management:  Regional for Post-op pain   Induction: Intravenous  Airway Management Planned: Simple Face Mask  Additional Equipment:   Intra-op Plan:   Post-operative Plan:   Informed Consent: I have reviewed the patients History and Physical, chart, labs and discussed the procedure including the risks, benefits and alternatives for the proposed anesthesia with the patient or authorized representative who has indicated his/her understanding and acceptance.   Dental Advisory Given  Plan Discussed with: Anesthesiologist, CRNA and Surgeon  Anesthesia Plan Comments:         Anesthesia Quick Evaluation

## 2016-04-01 NOTE — Anesthesia Procedure Notes (Signed)
Anesthesia Regional Block:  Adductor canal block  Pre-Anesthetic Checklist: ,, timeout performed, Correct Patient, Correct Site, Correct Laterality, Correct Procedure, Correct Position, site marked, Risks and benefits discussed,  Surgical consent,  Pre-op evaluation,  At surgeon's request and post-op pain management  Laterality: Right  Prep: chloraprep       Needles:  Injection technique: Single-shot  Needle Type: Stimiplex     Needle Length: 9cm 9 cm Needle Gauge: 21 G    Additional Needles:  Procedures: ultrasound guided (picture in chart) Adductor canal block Narrative:  Injection made incrementally with aspirations every 5 mL.  Performed by: Personally  Anesthesiologist: Dhiya Smits  Additional Notes: Risks, benefits and alternative to block explained extensively.  Patient tolerated procedure well, without complications.      

## 2016-04-01 NOTE — H&P (View-Only) (Signed)
TOTAL KNEE ADMISSION H&P  Patient is being admitted for left total knee arthroplasty.  Subjective:  Chief Complaint:left knee pain.  HPI: Jason [name], 55 y.o. male, has a history of pain and functional disability in the left knee due to arthritis and has failed non-surgical conservative treatments for greater than 12 weeks to includeNSAID's and/or analgesics, corticosteriod injections, viscosupplementation injections and activity modification.  Onset of symptoms was gradual, starting 5 years ago with gradually worsening course since that time. The patient noted no past surgery on the left knee(s).  Patient currently rates pain in the left knee(s) at 8 out of 10 with activity. Patient has night pain, worsening of pain with activity and weight bearing, pain that interferes with activities of daily living, pain with passive range of motion, crepitus and joint swelling.  Patient has evidence of periarticular osteophytes and joint space narrowing by imaging studies.There is no active infection.  There are no active problems to display for this patient.  No past medical history on file.  No past surgical history on file.   (Not in a hospital admission) Allergies not on file  Social History  Substance Use Topics  . Smoking status: Not on file  . Smokeless tobacco: Not on file  . Alcohol use Not on file    No family history on file.   Review of Systems  HENT: Positive for nosebleeds.   Musculoskeletal: Positive for joint pain.  Neurological: Positive for headaches.  All other systems reviewed and are negative.   Objective:  Physical Exam  Constitutional: He is oriented to person, place, and time. He appears well-developed and well-nourished. No distress.  HENT:  Head: Normocephalic and atraumatic.  Nose: Nose normal.  Eyes: Conjunctivae and EOM are normal. Pupils are equal, round, and reactive to light.  Neck: Normal range of motion. Neck supple.  Cardiovascular: Normal rate,  regular rhythm, normal heart sounds and intact distal pulses.   Respiratory: Effort normal and breath sounds normal. No respiratory distress. He has no wheezes.  GI: Soft. Bowel sounds are normal. He exhibits no distension. There is no tenderness.  Musculoskeletal:       Left knee: He exhibits decreased range of motion and swelling. Tenderness found.  Lymphadenopathy:    He has no cervical adenopathy.  Neurological: He is alert and oriented to person, place, and time. No cranial nerve deficit.  Skin: Skin is warm and dry. No rash noted. No erythema.  Psychiatric: He has a normal mood and affect. His behavior is normal.    Vital signs in last 24 hours: @VSRANGES@  Labs:   There is no height or weight on file to calculate BMI.   Imaging Review Plain radiographs demonstrate severe degenerative joint disease of the left knee(s). The overall alignment issignificant varus. The bone quality appears to be good for age and reported activity level.  Assessment/Plan:  End stage arthritis, left knee   The patient history, physical examination, clinical judgment of the provider and imaging studies are consistent with end stage degenerative joint disease of the left knee(s) and total knee arthroplasty is deemed medically necessary. The treatment options including medical management, injection therapy arthroscopy and arthroplasty were discussed at length. The risks and benefits of total knee arthroplasty were presented and reviewed. The risks due to aseptic loosening, infection, stiffness, patella tracking problems, thromboembolic complications and other imponderables were discussed. The patient acknowledged the explanation, agreed to proceed with the plan and consent was signed. Patient is being admitted for inpatient treatment for   surgery, pain control, PT, OT, prophylactic antibiotics, VTE prophylaxis, progressive ambulation and ADL's and discharge planning. The patient is planning to be discharged  home with home health services

## 2016-04-01 NOTE — Anesthesia Procedure Notes (Signed)
Procedure Name: MAC Date/Time: 04/01/2016 7:45 AM Performed by: Army FossaPULLIAM, Ajna Moors DANE Pre-anesthesia Checklist: Patient identified, Emergency Drugs available, Suction available and Patient being monitored Oxygen Delivery Method: Simple face mask

## 2016-04-01 NOTE — Anesthesia Postprocedure Evaluation (Signed)
Anesthesia Post Note  Patient: Jason [name]  Procedure(s) Performed: Procedure(s) (LRB): TOTAL KNEE ARTHROPLASTY (Left)  Patient location during evaluation: PACU Anesthesia Type: Spinal and Regional Level of consciousness: oriented and awake and alert Pain management: pain level controlled Vital Signs Assessment: post-procedure vital signs reviewed and stable Respiratory status: spontaneous breathing, respiratory function stable and patient connected to nasal cannula oxygen Cardiovascular status: blood pressure returned to baseline and stable Postop Assessment: no headache and no backache Anesthetic complications: no    Last Vitals:  Vitals:   04/01/16 0957 04/01/16 1003  BP: 114/83   Pulse: 76 79  Resp: 15 19  Temp:      Last Pain:  Vitals:   04/01/16 0630  TempSrc: Oral    LLE Motor Response:  (wiggles toes, in cpm) (04/01/16 1035)   RLE Motor Response:  (flickrs toes but lacks control of movement) (04/01/16 1035)   L Sensory Level: S1-Sole of foot, small toes (04/01/16 1035) R Sensory Level: S1-Sole of foot, small toes (04/01/16 1035)  Reino KentJudd, Mina Babula J

## 2016-04-02 ENCOUNTER — Encounter (HOSPITAL_COMMUNITY): Payer: Self-pay

## 2016-04-02 DIAGNOSIS — I10 Essential (primary) hypertension: Secondary | ICD-10-CM | POA: Diagnosis present

## 2016-04-02 DIAGNOSIS — E785 Hyperlipidemia, unspecified: Secondary | ICD-10-CM | POA: Diagnosis present

## 2016-04-02 LAB — CBC
HCT: 39.6 % (ref 39.0–52.0)
Hemoglobin: 13.2 g/dL (ref 13.0–17.0)
MCH: 29.3 pg (ref 26.0–34.0)
MCHC: 33.3 g/dL (ref 30.0–36.0)
MCV: 87.8 fL (ref 78.0–100.0)
Platelets: 165 10*3/uL (ref 150–400)
RBC: 4.51 MIL/uL (ref 4.22–5.81)
RDW: 12.2 % (ref 11.5–15.5)
WBC: 12.2 10*3/uL — ABNORMAL HIGH (ref 4.0–10.5)

## 2016-04-02 LAB — BASIC METABOLIC PANEL
Anion gap: 8 (ref 5–15)
BUN: 16 mg/dL (ref 6–20)
CO2: 24 mmol/L (ref 22–32)
Calcium: 9.1 mg/dL (ref 8.9–10.3)
Chloride: 105 mmol/L (ref 101–111)
Creatinine, Ser: 1.2 mg/dL (ref 0.61–1.24)
GFR calc Af Amer: >60 mL/min (ref >60)
GFR calc non Af Amer: >60 mL/min (ref >60)
Glucose, Bld: 129 mg/dL — ABNORMAL HIGH (ref 65–99)
Potassium: 3.9 mmol/L (ref 3.5–5.1)
Sodium: 137 mmol/L (ref 135–145)

## 2016-04-02 MED ORDER — POLYETHYLENE GLYCOL 3350 17 G PO PACK: 17.0000 g | PACK | Freq: Every day | ORAL | 0 refills | Status: DC | PRN

## 2016-04-02 NOTE — Care Management Note (Signed)
Case Management Note  Patient Details  Name: Jason [name] MRN: [id number] Date of Birth: 06/29/1961  Subjective/Objective:  55 y.o. M s/p L TKA  04/01/2016. CM consult for HHPT/ DME has already been delivered to home (CPM, RW and 3n1). Sp[facility] wit Jason Friendsonna George, [facility] to confirm [facility].                   Action/Plan: Anticipate discharge home today. No further CM needs but will be available should additional discharge needs arise.   Expected Discharge Date:                  Expected Discharge Plan:  Home w Home Health Services  In-House Referral:  NA  Discharge planning Services  CM Consult  Post Acute Care Choice:  Home Health Choice offered to:  Patient, Spouse  DME Arranged:   (All DME has been delivered to Home (CPM, 3n1, RW)) DME Agency:  NA  HH Arranged:  [facility]  Status of Service:  Completed, signed off  If discussed at Long Length of Stay Meetings, dates discussed:    Additional Comments:  [name], [name], RN 04/02/2016, 1:06 PM

## 2016-04-02 NOTE — Evaluation (Signed)
Occupational Therapy Evaluation/Discharge Patient Details Name: Jason [name] MRN: [id number] DOB: 1960-10-21 Today's Date: 04/02/2016    History of Present Illness Pt is a 55 y/o male s/p L TKA. No pertinent PMH.   Clinical Impression   Patient has been evaluated by Occupational Therapy with no acute OT needs identified. Pt able to complete most ADLs with supervision except LB dressing/bathing which his wife can provide assist for. Educated pt on safe technique for shower transfer, home safety, and fall prevention strategies. All education has been provided and pt has no further questions. Pt with no further acute OT needs. OT signing off. Thank you for this referral.    Follow Up Recommendations  No OT follow up;Supervision - Intermittent    Equipment Recommendations  None recommended by OT    Recommendations for Other Services       Precautions / Restrictions Precautions Precautions: Fall;Knee Precaution Booklet Issued: No Precaution Comments: Reviewed not placing objects under L knee Restrictions Weight Bearing Restrictions: Yes LLE Weight Bearing: Weight bearing as tolerated      Mobility Bed Mobility Overal bed mobility: Needs Assistance Bed Mobility: Sit to Supine     Supine to sit: Min assist;HOB elevated Sit to supine: Supervision   General bed mobility comments: Supervision for safety. Pt using R foot hooked under L ankle to move LLE onto bed.   Transfers Overall transfer level: Needs assistance Equipment used: Rolling walker (2 wheeled) Transfers: Sit to/from Stand Sit to Stand: Supervision         General transfer comment: Supervision for safety. VCs for safe hand placement    Balance Overall balance assessment: Needs assistance Sitting-balance support: No upper extremity supported;Feet supported Sitting balance-Leahy Scale: Good     Standing balance support: Bilateral upper extremity supported;During functional activity Standing  balance-Leahy Scale: Poor                              ADL Overall ADL's : Needs assistance/impaired Eating/Feeding: Independent;Sitting   Grooming: Wash/dry hands;Wash/dry face;Supervision/safety;Standing   Upper Body Bathing: Supervision/ safety;Sitting   Lower Body Bathing: Minimal assistance;Sit to/from stand Lower Body Bathing Details (indicate cue type and reason): wife can assist Upper Body Dressing : Supervision/safety;Sitting   Lower Body Dressing: Minimal assistance;Sit to/from stand Lower Body Dressing Details (indicate cue type and reason): wife can assist Toilet Transfer: Supervision/safety;Ambulation;BSC;RW   Toileting- Clothing Manipulation and Hygiene: Supervision/safety;Sit to/from stand   Tub/ Shower Transfer: Walk-in shower;Supervision/safety;Cueing for sequencing;Ambulation;Rolling walker Tub/Shower Transfer Details (indicate cue type and reason): Cues for proper step sequence with RW Functional mobility during ADLs: Supervision/safety;Rolling walker General ADL Comments: Educated pt/wife on fall prevention and home safety strategies.     Vision Vision Assessment?: No apparent visual deficits   Perception     Praxis      Pertinent Vitals/Pain Pain Assessment: Faces Faces Pain Scale: Hurts little more Pain Location: L knee Pain Descriptors / Indicators: Aching Pain Intervention(s): Limited activity within patient's tolerance;Monitored during session;Repositioned     Hand Dominance Right   Extremity/Trunk Assessment Upper Extremity Assessment Upper Extremity Assessment: Overall WFL for tasks assessed   Lower Extremity Assessment Lower Extremity Assessment: LLE deficits/detail LLE Deficits / Details: Pt with decreased strength and ROM limitations secondary to post-op. Sensation grossly intact.   Cervical / Trunk Assessment Cervical / Trunk Assessment: Normal   Communication Communication Communication: No difficulties   Cognition  Arousal/Alertness: Awake/alert Behavior During Therapy: WFL for tasks assessed/performed Overall Cognitive Status:  Within Functional Limits for tasks assessed                     General Comments       Exercises Exercises: Total Joint     Shoulder Instructions      Home Living Family/patient expects to be discharged to:: Private residence Living Arrangements: Spouse/significant other Available Help at Discharge: Family;Available 24 hours/day Type of Home: House Home Access: Stairs to enter Entergy CorporationEntrance Stairs-Number of Steps: 3 Entrance Stairs-Rails: None Home Layout: One level     Bathroom Shower/Tub: Producer, television/film/videoWalk-in shower   Bathroom Toilet: Handicapped height     Home Equipment: Environmental consultantWalker - 2 wheels;Bedside commode;Shower seat;Hand held shower head          Prior Functioning/Environment Level of Independence: Independent        Comments: Works, drives    OT Diagnosis: Acute pain   OT Problem List: Decreased strength;Decreased activity tolerance;Decreased range of motion;Impaired balance (sitting and/or standing);Decreased knowledge of use of DME or AE;Pain   OT Treatment/Interventions:      OT Goals(Current goals can be found in the care plan section) Acute Rehab OT Goals Patient Stated Goal: return home OT Goal Formulation: With patient Time For Goal Achievement: 04/16/16 Potential to Achieve Goals: Good  OT Frequency:     Barriers to D/C:            Co-evaluation              End of Session Equipment Utilized During Treatment: Gait belt;Rolling walker CPM Left Knee CPM Left Knee: Off Left Knee Flexion (Degrees): 60 Left Knee Extension (Degrees): 0 Nurse Communication: Mobility status  Activity Tolerance: Patient tolerated treatment well Patient left: in bed;with call bell/phone within reach;with family/visitor present   Time: 1055-1105 OT Time Calculation (min): 10 min Charges:  OT General Charges $OT Visit: 1 Procedure OT  Evaluation $OT Eval Low Complexity: 1 Procedure G-Codes:    Jason PyleJulia Haydon [name], OTR/L Pager: [phone]: [phone] 04/02/2016, 11:21 AM

## 2016-04-02 NOTE — Progress Notes (Signed)
Orthopaedic Trauma Service Progress Note Weekend Coverage  Subjective  Doing very well this am Some mild nausea but resolved now Did not take any pain meds last night after surgery  Took som tylenol this am and it has help tremendously   Got to chair with therapy this am  Walked in hall yesterday  If successful with stairs would like to go home today  Has already been in CPM for 2 hours today: 0-60 degrees. Thinks he can get to 70 degrees next session   Review of Systems  Constitutional: Negative for chills and fever.  Eyes: Negative for blurred vision and double vision.  Respiratory: Negative for shortness of breath and wheezing.   Cardiovascular: Negative for chest pain and palpitations.  Gastrointestinal: Negative for vomiting.  Genitourinary: Negative for dysuria.  Neurological: Negative for tingling, sensory change and headaches.     Objective   BP (!) 149/97 (BP Location: Left Arm)   Pulse 87   Temp 98.4 F (36.9 C) (Oral)   Resp 16   Ht 6' (1.829 m)   Wt 103 kg (227 lb)   SpO2 96%   BMI 30.79 kg/m   Intake/Output      09/01 0701 - 09/02 0700 09/02 0701 - 09/03 0700   P.O. 480    I.V. (mL/kg) 2400 (23.3)    Total Intake(mL/kg) 2880 (28)    Urine (mL/kg/hr) 1870 (0.8)    Stool 0 (0)    Blood 75 (0)    Total Output 1945     Net +935          Urine Occurrence 1 x    Stool Occurrence 1 x      Labs  Results for GYASI, HAZZARD (MRN [id number]) as of 04/02/2016 09:46  Ref. Range 04/02/2016 06:13  Sodium Latest Ref Range: 135 - 145 mmol/L 137  Potassium Latest Ref Range: 3.5 - 5.1 mmol/L 3.9  Chloride Latest Ref Range: 101 - 111 mmol/L 105  CO2 Latest Ref Range: 22 - 32 mmol/L 24  BUN Latest Ref Range: 6 - 20 mg/dL 16  Creatinine Latest Ref Range: 0.61 - 1.24 mg/dL 1.20  Calcium Latest Ref Range: 8.9 - 10.3 mg/dL 9.1  EGFR (Non-African Amer.) Latest Ref Range: >60 mL/min >60  EGFR (African American) Latest Ref Range: >60 mL/min >60  Glucose Latest Ref  Range: 65 - 99 mg/dL 129 (H)  Anion gap Latest Ref Range: 5 - 15  8  WBC Latest Ref Range: 4.0 - 10.5 K/uL 12.2 (H)  RBC Latest Ref Range: 4.22 - 5.81 MIL/uL 4.51  Hemoglobin Latest Ref Range: 13.0 - 17.0 g/dL 13.2  HCT Latest Ref Range: 39.0 - 52.0 % 39.6  MCV Latest Ref Range: 78.0 - 100.0 fL 87.8  MCH Latest Ref Range: 26.0 - 34.0 pg 29.3  MCHC Latest Ref Range: 30.0 - 36.0 g/dL 33.3  RDW Latest Ref Range: 11.5 - 15.5 % 12.2  Platelets Latest Ref Range: 150 - 400 K/uL 165    Exam  XIP:JASNK and alert, no acute distress, sitting in bedside chair Lungs: Clear to auscultation bilaterally Cardiac: Regular rate and rhythm Abd: Nontender, nondistended, + bowel sounds Ext:   Left lower extremity   Dressing is clean dry and intact   Distal motor and sensory functions are intact   Patient can perform a straight leg raise   + DP pulse   compartments are soft and nontender   No pain with passive stretch       Assessment  and Plan   POD/HD#: 1  55 y/o male s/p L TKA   - 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 French Ana in 2 weeks  HHPT    If pt successfully navigates stairs with PT he will be dc'd today   - Pain management:  Tylenol, oxycodone   - ABL anemia/Hemodynamics  Stable  - DVT/PE prophylaxis:  Apixaban  - ID:   periop abx completed    - Dispo:  Likely home today after PT  Follow up with Dr. French Ana in 2 weeks     Jari Pigg, PA-C [facility] [phone] (O) 04/02/2016 9:44 AM

## 2016-04-02 NOTE — Discharge Summary (Signed)
Orthopaedic Trauma Service (OTS)  Patient ID: Jason [name] Jason [name] [name] MRN: [id number] DOB/AGE: [birth year] 53 y.o.  Admit date: 04/01/2016 Discharge date: 04/02/2016  Admission Diagnoses: End stage DJD L knee HTN Hyperlipidemia   Discharge Diagnoses:  Principal Problem:   Primary localized osteoarthritis of left knee Active Problems:   HTN (hypertension)   Hyperlipidemia   Procedures Performed: 04/01/2016- Dr. French [name] 1.Left total knee replacement (Sigma size 5 femur, tibia 12.5 mm bearing with 41 mm all poly patella   Discharged Condition: good  Hospital Course:   55 year old male admitted on 04/01/2016 for left total knee arthroplasty. Patient was taken to the OR 04/01/2016 the procedure noted above. Patient tolerated the procedure well. After surgery he was transferred to PACU for recovery from anesthesia and then transferred to the orthopedic floor for observation, pain control and therapies. Patient's hospital stay was uncomplicated. On postoperative day #1 he was mobilizing well with therapy had a bowel movement and was tolerating oral diet. He felt that he could return home on postoperative day #1. No acute issues were noted during his hospital stay. Home health PT was arranged for the patient prior to discharge. At the time of discharge patient is awaiting his home CPM. Patient will remain on apixaban for DVT and PE prophylaxis.  Consults: None  Significant Diagnostic Studies: labs:  Results for Jason [name], Jason [name] [name] (MRN [id number]) as of 04/02/2016 09:46  Ref. Range 04/02/2016 06:13  Sodium Latest Ref Range: 135 - 145 mmol/L 137  Potassium Latest Ref Range: 3.5 - 5.1 mmol/L 3.9  Chloride Latest Ref Range: 101 - 111 mmol/L 105  CO2 Latest Ref Range: 22 - 32 mmol/L 24  BUN Latest Ref Range: 6 - 20 mg/dL 16  Creatinine Latest Ref Range: 0.61 - 1.24 mg/dL 1.20  Calcium Latest Ref Range: 8.9 - 10.3 mg/dL 9.1  EGFR (Non-African Amer.) Latest Ref Range: >60 mL/min >60  EGFR (African  American) Latest Ref Range: >60 mL/min >60  Glucose Latest Ref Range: 65 - 99 mg/dL 129 (H)  Anion gap Latest Ref Range: 5 - 15  8  WBC Latest Ref Range: 4.0 - 10.5 K/uL 12.2 (H)  RBC Latest Ref Range: 4.22 - 5.81 MIL/uL 4.51  Hemoglobin Latest Ref Range: 13.0 - 17.0 g/dL 13.2  HCT Latest Ref Range: 39.0 - 52.0 % 39.6  MCV Latest Ref Range: 78.0 - 100.0 fL 87.8  MCH Latest Ref Range: 26.0 - 34.0 pg 29.3  MCHC Latest Ref Range: 30.0 - 36.0 g/dL 33.3  RDW Latest Ref Range: 11.5 - 15.5 % 12.2  Platelets Latest Ref Range: 150 - 400 K/uL 165     Treatments: IV hydration, antibiotics: Ancef, analgesia: acetaminophen and Oxy IR, anticoagulation: Apixaban, therapies: PT, OT and RN and surgery: as above   Discharge Exam:               Orthopaedic Trauma Service Progress Note Weekend Coverage   Subjective   Doing very well this am Some mild nausea but resolved now Did not take any pain meds last night after surgery  Took som tylenol this am and it has help tremendously    Got to chair with therapy this am  Walked in hall yesterday  If successful with stairs would like to go home today   Has already been in CPM for 2 hours today: 0-60 degrees. Thinks he can get to 70 degrees next session    Review of Systems  Constitutional: Negative for chills and fever.  Eyes: Negative  for blurred vision and double vision.  Respiratory: Negative for shortness of breath and wheezing.   Cardiovascular: Negative for chest pain and palpitations.  Gastrointestinal: Negative for vomiting.  Genitourinary: Negative for dysuria.  Neurological: Negative for tingling, sensory change and headaches.        Objective    BP (!) 149/97 (BP Location: Left Arm)   Pulse 87   Temp 98.4 F (36.9 C) (Oral)   Resp 16   Ht 6' (1.829 m)   Wt 103 kg (227 lb)   SpO2 96%   BMI 30.79 kg/m    Intake/Output      09/01 0701 - 09/02 0700 09/02 0701 - 09/03 0700   P.O. 480    I.V. (mL/kg) 2400 (23.3)    Total  Intake(mL/kg) 2880 (28)    Urine (mL/kg/hr) 1870 (0.8)    Stool 0 (0)    Blood 75 (0)    Total Output 1945     Net +935          Urine Occurrence 1 x    Stool Occurrence 1 x       Labs   Results for Jason [name], Jason [name] [name] (MRN [id number]) as of 04/02/2016 09:46   Ref. Range 04/02/2016 06:13  Sodium Latest Ref Range: 135 - 145 mmol/L 137  Potassium Latest Ref Range: 3.5 - 5.1 mmol/L 3.9  Chloride Latest Ref Range: 101 - 111 mmol/L 105  CO2 Latest Ref Range: 22 - 32 mmol/L 24  BUN Latest Ref Range: 6 - 20 mg/dL 16  Creatinine Latest Ref Range: 0.61 - 1.24 mg/dL 1.20  Calcium Latest Ref Range: 8.9 - 10.3 mg/dL 9.1  EGFR (Non-African Amer.) Latest Ref Range: >60 mL/min >60  EGFR (African American) Latest Ref Range: >60 mL/min >60  Glucose Latest Ref Range: 65 - 99 mg/dL 129 (H)  Anion gap Latest Ref Range: 5 - 15  8  WBC Latest Ref Range: 4.0 - 10.5 K/uL 12.2 (H)  RBC Latest Ref Range: 4.22 - 5.81 MIL/uL 4.51  Hemoglobin Latest Ref Range: 13.0 - 17.0 g/dL 13.2  HCT Latest Ref Range: 39.0 - 52.0 % 39.6  MCV Latest Ref Range: 78.0 - 100.0 fL 87.8  MCH Latest Ref Range: 26.0 - 34.0 pg 29.3  MCHC Latest Ref Range: 30.0 - 36.0 g/dL 33.3  RDW Latest Ref Range: 11.5 - 15.5 % 12.2  Platelets Latest Ref Range: 150 - 400 K/uL 165      Exam   YSA:YTKZS and alert, no acute distress, sitting in bedside chair Lungs: Clear to auscultation bilaterally Cardiac: Regular rate and rhythm Abd: Nontender, nondistended, + bowel sounds Ext:                        Left lower extremity                                             Dressing is clean dry and intact                                             Distal motor and sensory functions are intact  Patient can perform a straight leg raise                                             + DP pulse                                             compartments are soft and nontender                                              No pain with passive stretch        Assessment and Plan    POD/HD#: 1   55 y/o male s/p L TKA     - 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 French [name] in 2 weeks                       HHPT                          If pt successfully navigates stairs with PT he will be dc'd today    - Pain management:                       Tylenol, oxycodone    - ABL anemia/Hemodynamics                       Stable   - DVT/PE prophylaxis:                       Apixaban  - ID:                        periop abx completed      - Dispo:                       Likely home today after PT                       Follow up with Dr. French [name] in 2 weeks       Disposition: home  with HHPT    Medication List    TAKE these medications   apixaban 2.5 MG Tabs tablet Commonly known as:  ELIQUIS Take 1 tablet (2.5 mg total) by mouth 2 (two) times daily.   aspirin EC 81 MG tablet Take 81 mg by mouth daily.   Fish Oil 1200 MG Caps Take 1,200 mg by mouth daily.   Glucosamine-Chondroitin 750-600 MG Tabs Take 1 tablet by mouth daily.   losartan 100 MG tablet Commonly known as:  COZAAR Take 100 mg by mouth daily.   niacin 1000 MG CR tablet Commonly known as:  NIASPAN Take 1,000 mg by mouth at bedtime.   oxyCODONE-acetaminophen 5-325 MG tablet Commonly known as:  ROXICET Take 1-2 tablets by mouth every 4 (four) hours as needed for moderate pain or severe pain.   polyethylene glycol packet Commonly known as:  MIRALAX / GLYCOLAX Take 17 g by mouth daily as needed for mild constipation.      Follow-up Information    CAFFREY JR,W D, MD. Schedule an appointment as soon as possible for a visit in 2 week(s).   Specialty:   Orthopedic Surgery Contact information: [address] [place] [address] [phone]           Signed:  Jari Pigg, PA-C [facility] [phone] (P) 04/02/2016, 9:57 AM

## 2016-04-02 NOTE — Progress Notes (Signed)
Physical Therapy Treatment Patient Details Name: Jason [name] MRN: [id number] DOB: Aug 25, 1960 Today's Date: 04/02/2016    History of Present Illness Pt is a 55 y/o male s/p L TKA. No pertinent PMH.    PT Comments    Pt presented supine in bed with HOB elevated, awake and eager to participate in therapy session. Pt successfully completed stair training during this session with use of RW. PT reviewed HEP handout with pt as well. Pt would continue to benefit from skilled physical therapy services at this time while admitted and after d/c to address his limitations in order to improve his overall safety and independence with functional mobility.   Follow Up Recommendations  Home health PT;Supervision for mobility/OOB     Equipment Recommendations  None recommended by PT;Other (comment)    Recommendations for Other Services       Precautions / Restrictions Precautions Precautions: Fall;Knee Precaution Booklet Issued: Yes (comment) Precaution Comments: PT reviewed LE positioning following TKA surgery with pt and wife Restrictions Weight Bearing Restrictions: Yes LLE Weight Bearing: Weight bearing as tolerated    Mobility  Bed Mobility Overal bed mobility: Needs Assistance Bed Mobility: Supine to Sit     Supine to sit: Min assist;HOB elevated     General bed mobility comments: pt required min A with L LE  Transfers Overall transfer level: Needs assistance Equipment used: Rolling walker (2 wheeled) Transfers: Sit to/from Stand Sit to Stand: Supervision         General transfer comment: Supervision for safety. VCs for safe hand placement  Ambulation/Gait Ambulation/Gait assistance: Supervision Ambulation Distance (Feet): 150 Feet Assistive device: Rolling walker (2 wheeled) Gait Pattern/deviations: Step-to pattern;Decreased step length - right;Decreased stance time - left;Decreased weight shift to left Gait velocity: decreased Gait velocity interpretation: Below  normal speed for age/gender     Stairs Stairs: Yes Stairs assistance: Min assist Stair Management: No rails;Step to pattern;Backwards;With walker Number of Stairs: 3 General stair comments: pt ascended with R LE leading and descended with L LE leading  Wheelchair Mobility    Modified Rankin (Stroke Patients Only)       Balance Overall balance assessment: Needs assistance Sitting-balance support: Feet supported;No upper extremity supported Sitting balance-Leahy Scale: Good     Standing balance support: During functional activity;Bilateral upper extremity supported Standing balance-Leahy Scale: Poor                      Cognition Arousal/Alertness: Awake/alert Behavior During Therapy: WFL for tasks assessed/performed Overall Cognitive Status: Within Functional Limits for tasks assessed                      Exercises Total Joint Exercises Goniometric ROM: Flexion = 44 degrees; Extension = lacking 23 degrees to neutral; measured in sitting    General Comments        Pertinent Vitals/Pain Pain Assessment: Faces Faces Pain Scale: Hurts a little bit Pain Location: L knee Pain Descriptors / Indicators: Sore Pain Intervention(s): Monitored during session;Repositioned;Ice applied    Home Living                      Prior Function            PT Goals (current goals can now be found in the care plan section) Acute Rehab PT Goals Patient Stated Goal: return home PT Goal Formulation: With patient/family Time For Goal Achievement: 04/08/16 Potential to Achieve Goals: Good Progress towards PT goals: Progressing toward  goals    Frequency  7X/week    PT Plan Current plan remains appropriate    Co-evaluation             End of Session Equipment Utilized During Treatment: Gait belt Activity Tolerance: Patient tolerated treatment well Patient left: in chair;with call bell/phone within reach;with family/visitor present     Time:  0981-1914 PT Time Calculation (min) (ACUTE ONLY): 19 min  Charges:  $Gait Training: 8-22 mins                    G CodesAlessandra Bevels Ivalee [name] April 24, 2016, 4:02 PM Jason [name], PT, DPT [phone]

## 2016-04-02 NOTE — Op Note (Signed)
Jason Pedro:  [name], Jason            ACCOUNT NO.:  [id number]  MEDICAL RECORD NO.:  [id number]  LOCATION:  [id number]                        FACILITY:  [facility]  PHYSICIAN:  Dyke BrackettW. D. Gianno Volner, M.D.    DATE OF BIRTH:  Mar 25, 1961  DATE OF PROCEDURE:  04/01/2016 DATE OF DISCHARGE:                              OPERATIVE REPORT   PREOPERATIVE DIAGNOSIS:  Severe osteoarthritis, left knee with varus deformity and flexion contracture.  POSTOPERATIVE DIAGNOSIS:  Severe osteoarthritis, left knee with varus deformity and flexion contracture.  OPERATION:  Left total knee replacement (Sigma size 5 femur, tibia 12.5 mm bearing with 41 mm all poly patella.  SURGEON:  Dyke BrackettW. D. Jacorion Klem, MD.  ASSISTANT:  Margart SicklesJoshua Chadwell, PA-C.  ANESTHESIA:  Spinal anesthetic with nerve block.  Local supplementation with Exparel and Marcaine.  TOURNIQUET TIME:  60 minute.  DESCRIPTION OF PROCEDURE:  Supine position, sanguinous, leg placed in a tourniquet at 350.  A straight skin incision with a medial parapatellar approach to the knee was made.  Significant varus deformity with flexion contracture was encountered.  We cut 11-degree cut and 5-degree valgus on the distal femur.  The cutting the appropriate degree of valgus with about 4 mm below the most diseased medial compartment with extensive osteophytes noted on the medial side with stripping of the medial side as well to balance the knee.  The extension gap was measured at 12.5 mm. We sized the femur to be a size 5, followed by placement of the pins with appropriate degree of external rotation followed by the anterior- posterior chamber cuts.  Attention was next directed to the tibia.  We released the PCL which was done as well as removal of posterior osteophytes cleaned from the posterior aspect of the knee.  We placed the keel for the tibial prosthesis with the trial tibia.  Box cut was next made for the femur.  Patella was cut leaving about 18 mm for an all poly  patellar trial.  We placed trial components in tibia, femur, and patella.  Full extension was obtained.  The patient had at least 15- degree flexion contracture preoperatively with varus deformity which was resolved.  Cement was prepared on the back table.  We infiltrated the knee with the mixture of Marcaine and Exparel including the deep capsular structures.  Final components were inserted in tibia followed by femur and patella.  We inserted the trial bearing while the cement hardened.  Trial bearing was removed.  We checked the posterior aspect of the knee for any loose excess cement.  Again, the tourniquet was released with the bearing out.  No excessive bleeding was noted.  Closure was effected after placement of the final bearing with #1 Ethibond, 2-0 Vicryl, and skin clips.  Taken to recovery room in stable condition with lightly compressive knee immobilizer applied.     Dyke BrackettW. D. Roxi Hlavaty, M.D.     WDC/MEDQ  D:  04/01/2016  T:  04/02/2016  Job:  [id number]

## 2016-04-02 NOTE — Progress Notes (Signed)
Patient discharged to home, discharge instructions given, patient stated he understood 

## 2016-04-02 NOTE — Progress Notes (Signed)
Orthopedic Tech Progress Note Patient Details:  Jason [name] 05-23-1961 [id number]  CPM Left Knee CPM Left Knee: On Left Knee Flexion (Degrees): 60 Left Knee Extension (Degrees): 0 Additional Comments: Trapeze bar and foot roll CPM on 0555.  Clois Dupesvery S Nyasha Rahilly 04/02/2016, 5:54 AM

## 2016-04-02 NOTE — Progress Notes (Signed)
Physical Therapy Treatment Patient Details Name: Jason [name] MRN: [id number] DOB: Dec 21, 1960 Today's Date: 04/02/2016    History of Present Illness Pt is a 55 y/o male s/p L TKA. No pertinent PMH.    PT Comments    Pt presented supine in bed with HOB elevated, awake and willing to participate in therapy session. Pt reported having some nausea last night and again this morning. Upon sitting in recliner at end of session, pt reported feeling lightheaded, dizzy and nauseous. Pt would continue to benefit from skilled physical therapy services at this time while admitted and after d/c to address his limitations in order to improve his overall safety and independence with functional mobility. PT plans to perform stair training at next session.   Follow Up Recommendations  Home health PT;Supervision for mobility/OOB     Equipment Recommendations  None recommended by PT;Other (comment) (pt reported having all necessary DME at home)    Recommendations for Other Services       Precautions / Restrictions Precautions Precautions: Fall;Knee Precaution Comments: PT reviewed LE positioning following TKA surgery with pt. Restrictions Weight Bearing Restrictions: Yes LLE Weight Bearing: Weight bearing as tolerated    Mobility  Bed Mobility Overal bed mobility: Needs Assistance Bed Mobility: Supine to Sit     Supine to sit: Min assist;HOB elevated     General bed mobility comments: pt required increased time and use of bed rails, min A for L LE movement  Transfers Overall transfer level: Needs assistance Equipment used: Rolling walker (2 wheeled) Transfers: Sit to/from Stand Sit to Stand: Min guard         General transfer comment: pt required increased time and VC'ing for bilateral hand positioning  Ambulation/Gait Ambulation/Gait assistance: Min guard Ambulation Distance (Feet): 25 Feet (25 ft x2 with sitting break on toilet for BM) Assistive device: Rolling walker (2  wheeled) Gait Pattern/deviations: Step-to pattern;Decreased step length - right;Decreased stance time - left;Decreased weight shift to left Gait velocity: decreased Gait velocity interpretation: Below normal speed for age/gender     Stairs            Wheelchair Mobility    Modified Rankin (Stroke Patients Only)       Balance Overall balance assessment: Needs assistance Sitting-balance support: Feet supported;No upper extremity supported Sitting balance-Leahy Scale: Fair     Standing balance support: During functional activity;Bilateral upper extremity supported Standing balance-Leahy Scale: Poor                      Cognition Arousal/Alertness: Awake/alert Behavior During Therapy: WFL for tasks assessed/performed Overall Cognitive Status: Within Functional Limits for tasks assessed                      Exercises Total Joint Exercises Ankle Circles/Pumps: AROM;Left;10 reps;Seated Quad Sets: AROM;Strengthening;Left;10 reps;Seated Long Arc Quad: AROM;Strengthening;Left;5 reps;Seated Knee Flexion: AROM;Strengthening;Left;5 reps;Seated    General Comments        Pertinent Vitals/Pain Pain Assessment: Faces Faces Pain Scale: Hurts little more Pain Location: L knee Pain Descriptors / Indicators: Grimacing;Guarding Pain Intervention(s): Monitored during session;Repositioned;Ice applied    Home Living                      Prior Function            PT Goals (current goals can now be found in the care plan section) Acute Rehab PT Goals Patient Stated Goal: return home PT Goal Formulation: With patient/family  Time For Goal Achievement: 04/08/16 Potential to Achieve Goals: Good Progress towards PT goals: Progressing toward goals    Frequency  7X/week    PT Plan Current plan remains appropriate    Co-evaluation             End of Session Equipment Utilized During Treatment: Gait belt Activity Tolerance: Patient limited by  pain;Other (comment) (pt limited secondary to nausea and feeling light-headed) Patient left: in chair;with call bell/phone within reach;with family/visitor present     Time: 0852-0920 PT Time Calculation (min) (ACUTE ONLY): 28 min  Charges:  $Gait Training: 8-22 mins $Therapeutic Exercise: 8-22 mins                    G CodesAlessandra Bevels:      Jarita Raval M Roy Snuffer 04/02/2016, 9:43 AM Deborah ChalkJennifer Jimia Gentles, PT, DPT [phone]

## 2016-04-04 ENCOUNTER — Encounter (HOSPITAL_COMMUNITY): Payer: Self-pay | Admitting: Orthopedic Surgery

## 2016-04-05 DIAGNOSIS — Z7982 Long term (current) use of aspirin: Secondary | ICD-10-CM | POA: Diagnosis not present

## 2016-04-05 DIAGNOSIS — Z96652 Presence of left artificial knee joint: Secondary | ICD-10-CM | POA: Diagnosis not present

## 2016-04-05 DIAGNOSIS — Z7901 Long term (current) use of anticoagulants: Secondary | ICD-10-CM | POA: Diagnosis not present

## 2016-04-05 DIAGNOSIS — E785 Hyperlipidemia, unspecified: Secondary | ICD-10-CM | POA: Diagnosis not present

## 2016-04-05 DIAGNOSIS — Z471 Aftercare following joint replacement surgery: Secondary | ICD-10-CM | POA: Diagnosis not present

## 2016-04-05 DIAGNOSIS — I10 Essential (primary) hypertension: Secondary | ICD-10-CM | POA: Diagnosis not present

## 2016-04-06 DIAGNOSIS — Z471 Aftercare following joint replacement surgery: Secondary | ICD-10-CM | POA: Diagnosis not present

## 2016-04-06 DIAGNOSIS — Z96652 Presence of left artificial knee joint: Secondary | ICD-10-CM | POA: Diagnosis not present

## 2016-04-06 DIAGNOSIS — I10 Essential (primary) hypertension: Secondary | ICD-10-CM | POA: Diagnosis not present

## 2016-04-06 DIAGNOSIS — Z7982 Long term (current) use of aspirin: Secondary | ICD-10-CM | POA: Diagnosis not present

## 2016-04-06 DIAGNOSIS — E785 Hyperlipidemia, unspecified: Secondary | ICD-10-CM | POA: Diagnosis not present

## 2016-04-06 DIAGNOSIS — Z7901 Long term (current) use of anticoagulants: Secondary | ICD-10-CM | POA: Diagnosis not present

## 2016-04-08 DIAGNOSIS — E785 Hyperlipidemia, unspecified: Secondary | ICD-10-CM | POA: Diagnosis not present

## 2016-04-08 DIAGNOSIS — Z7982 Long term (current) use of aspirin: Secondary | ICD-10-CM | POA: Diagnosis not present

## 2016-04-08 DIAGNOSIS — I10 Essential (primary) hypertension: Secondary | ICD-10-CM | POA: Diagnosis not present

## 2016-04-08 DIAGNOSIS — Z96652 Presence of left artificial knee joint: Secondary | ICD-10-CM | POA: Diagnosis not present

## 2016-04-08 DIAGNOSIS — Z7901 Long term (current) use of anticoagulants: Secondary | ICD-10-CM | POA: Diagnosis not present

## 2016-04-08 DIAGNOSIS — Z471 Aftercare following joint replacement surgery: Secondary | ICD-10-CM | POA: Diagnosis not present

## 2016-04-11 DIAGNOSIS — Z7901 Long term (current) use of anticoagulants: Secondary | ICD-10-CM | POA: Diagnosis not present

## 2016-04-11 DIAGNOSIS — Z471 Aftercare following joint replacement surgery: Secondary | ICD-10-CM | POA: Diagnosis not present

## 2016-04-11 DIAGNOSIS — E785 Hyperlipidemia, unspecified: Secondary | ICD-10-CM | POA: Diagnosis not present

## 2016-04-11 DIAGNOSIS — Z7982 Long term (current) use of aspirin: Secondary | ICD-10-CM | POA: Diagnosis not present

## 2016-04-11 DIAGNOSIS — I10 Essential (primary) hypertension: Secondary | ICD-10-CM | POA: Diagnosis not present

## 2016-04-11 DIAGNOSIS — Z96652 Presence of left artificial knee joint: Secondary | ICD-10-CM | POA: Diagnosis not present

## 2016-04-13 DIAGNOSIS — Z96652 Presence of left artificial knee joint: Secondary | ICD-10-CM | POA: Diagnosis not present

## 2016-04-13 DIAGNOSIS — Z7901 Long term (current) use of anticoagulants: Secondary | ICD-10-CM | POA: Diagnosis not present

## 2016-04-13 DIAGNOSIS — Z7982 Long term (current) use of aspirin: Secondary | ICD-10-CM | POA: Diagnosis not present

## 2016-04-13 DIAGNOSIS — Z471 Aftercare following joint replacement surgery: Secondary | ICD-10-CM | POA: Diagnosis not present

## 2016-04-13 DIAGNOSIS — E785 Hyperlipidemia, unspecified: Secondary | ICD-10-CM | POA: Diagnosis not present

## 2016-04-13 DIAGNOSIS — I10 Essential (primary) hypertension: Secondary | ICD-10-CM | POA: Diagnosis not present

## 2016-04-14 DIAGNOSIS — M1712 Unilateral primary osteoarthritis, left knee: Secondary | ICD-10-CM | POA: Diagnosis not present

## 2016-04-15 DIAGNOSIS — Z96652 Presence of left artificial knee joint: Secondary | ICD-10-CM | POA: Diagnosis not present

## 2016-04-15 DIAGNOSIS — Z7982 Long term (current) use of aspirin: Secondary | ICD-10-CM | POA: Diagnosis not present

## 2016-04-15 DIAGNOSIS — I10 Essential (primary) hypertension: Secondary | ICD-10-CM | POA: Diagnosis not present

## 2016-04-15 DIAGNOSIS — Z7901 Long term (current) use of anticoagulants: Secondary | ICD-10-CM | POA: Diagnosis not present

## 2016-04-15 DIAGNOSIS — E785 Hyperlipidemia, unspecified: Secondary | ICD-10-CM | POA: Diagnosis not present

## 2016-04-15 DIAGNOSIS — Z471 Aftercare following joint replacement surgery: Secondary | ICD-10-CM | POA: Diagnosis not present

## 2016-04-20 DIAGNOSIS — Z471 Aftercare following joint replacement surgery: Secondary | ICD-10-CM | POA: Diagnosis not present

## 2016-04-20 DIAGNOSIS — M6281 Muscle weakness (generalized): Secondary | ICD-10-CM | POA: Diagnosis not present

## 2016-04-20 DIAGNOSIS — R2689 Other abnormalities of gait and mobility: Secondary | ICD-10-CM | POA: Diagnosis not present

## 2016-04-20 DIAGNOSIS — Z96652 Presence of left artificial knee joint: Secondary | ICD-10-CM | POA: Diagnosis not present

## 2016-04-22 DIAGNOSIS — R2689 Other abnormalities of gait and mobility: Secondary | ICD-10-CM | POA: Diagnosis not present

## 2016-04-22 DIAGNOSIS — Z96652 Presence of left artificial knee joint: Secondary | ICD-10-CM | POA: Diagnosis not present

## 2016-04-22 DIAGNOSIS — Z471 Aftercare following joint replacement surgery: Secondary | ICD-10-CM | POA: Diagnosis not present

## 2016-04-22 DIAGNOSIS — M6281 Muscle weakness (generalized): Secondary | ICD-10-CM | POA: Diagnosis not present

## 2016-04-25 DIAGNOSIS — Z96652 Presence of left artificial knee joint: Secondary | ICD-10-CM | POA: Diagnosis not present

## 2016-04-25 DIAGNOSIS — M6281 Muscle weakness (generalized): Secondary | ICD-10-CM | POA: Diagnosis not present

## 2016-04-25 DIAGNOSIS — Z471 Aftercare following joint replacement surgery: Secondary | ICD-10-CM | POA: Diagnosis not present

## 2016-04-25 DIAGNOSIS — R2689 Other abnormalities of gait and mobility: Secondary | ICD-10-CM | POA: Diagnosis not present

## 2016-04-28 DIAGNOSIS — Z96652 Presence of left artificial knee joint: Secondary | ICD-10-CM | POA: Diagnosis not present

## 2016-04-28 DIAGNOSIS — Z471 Aftercare following joint replacement surgery: Secondary | ICD-10-CM | POA: Diagnosis not present

## 2016-04-28 DIAGNOSIS — M6281 Muscle weakness (generalized): Secondary | ICD-10-CM | POA: Diagnosis not present

## 2016-04-28 DIAGNOSIS — R2689 Other abnormalities of gait and mobility: Secondary | ICD-10-CM | POA: Diagnosis not present

## 2016-05-02 DIAGNOSIS — Z96652 Presence of left artificial knee joint: Secondary | ICD-10-CM | POA: Diagnosis not present

## 2016-05-02 DIAGNOSIS — R2689 Other abnormalities of gait and mobility: Secondary | ICD-10-CM | POA: Diagnosis not present

## 2016-05-02 DIAGNOSIS — Z471 Aftercare following joint replacement surgery: Secondary | ICD-10-CM | POA: Diagnosis not present

## 2016-05-02 DIAGNOSIS — M6281 Muscle weakness (generalized): Secondary | ICD-10-CM | POA: Diagnosis not present

## 2016-05-04 DIAGNOSIS — M6281 Muscle weakness (generalized): Secondary | ICD-10-CM | POA: Diagnosis not present

## 2016-05-04 DIAGNOSIS — Z96652 Presence of left artificial knee joint: Secondary | ICD-10-CM | POA: Diagnosis not present

## 2016-05-04 DIAGNOSIS — R2689 Other abnormalities of gait and mobility: Secondary | ICD-10-CM | POA: Diagnosis not present

## 2016-05-04 DIAGNOSIS — Z471 Aftercare following joint replacement surgery: Secondary | ICD-10-CM | POA: Diagnosis not present

## 2016-05-09 DIAGNOSIS — R2689 Other abnormalities of gait and mobility: Secondary | ICD-10-CM | POA: Diagnosis not present

## 2016-05-09 DIAGNOSIS — Z96652 Presence of left artificial knee joint: Secondary | ICD-10-CM | POA: Diagnosis not present

## 2016-05-09 DIAGNOSIS — M6281 Muscle weakness (generalized): Secondary | ICD-10-CM | POA: Diagnosis not present

## 2016-05-09 DIAGNOSIS — Z471 Aftercare following joint replacement surgery: Secondary | ICD-10-CM | POA: Diagnosis not present

## 2016-05-11 DIAGNOSIS — R2689 Other abnormalities of gait and mobility: Secondary | ICD-10-CM | POA: Diagnosis not present

## 2016-05-11 DIAGNOSIS — Z471 Aftercare following joint replacement surgery: Secondary | ICD-10-CM | POA: Diagnosis not present

## 2016-05-11 DIAGNOSIS — M6281 Muscle weakness (generalized): Secondary | ICD-10-CM | POA: Diagnosis not present

## 2016-05-11 DIAGNOSIS — Z96652 Presence of left artificial knee joint: Secondary | ICD-10-CM | POA: Diagnosis not present

## 2016-05-12 DIAGNOSIS — M1712 Unilateral primary osteoarthritis, left knee: Secondary | ICD-10-CM | POA: Diagnosis not present

## 2016-05-16 DIAGNOSIS — Z471 Aftercare following joint replacement surgery: Secondary | ICD-10-CM | POA: Diagnosis not present

## 2016-05-16 DIAGNOSIS — Z96652 Presence of left artificial knee joint: Secondary | ICD-10-CM | POA: Diagnosis not present

## 2016-05-16 DIAGNOSIS — M6281 Muscle weakness (generalized): Secondary | ICD-10-CM | POA: Diagnosis not present

## 2016-05-16 DIAGNOSIS — R2689 Other abnormalities of gait and mobility: Secondary | ICD-10-CM | POA: Diagnosis not present

## 2016-05-18 DIAGNOSIS — R2689 Other abnormalities of gait and mobility: Secondary | ICD-10-CM | POA: Diagnosis not present

## 2016-05-18 DIAGNOSIS — Z471 Aftercare following joint replacement surgery: Secondary | ICD-10-CM | POA: Diagnosis not present

## 2016-05-18 DIAGNOSIS — M6281 Muscle weakness (generalized): Secondary | ICD-10-CM | POA: Diagnosis not present

## 2016-05-18 DIAGNOSIS — Z96652 Presence of left artificial knee joint: Secondary | ICD-10-CM | POA: Diagnosis not present

## 2016-05-23 DIAGNOSIS — M6281 Muscle weakness (generalized): Secondary | ICD-10-CM | POA: Diagnosis not present

## 2016-05-23 DIAGNOSIS — R2689 Other abnormalities of gait and mobility: Secondary | ICD-10-CM | POA: Diagnosis not present

## 2016-05-23 DIAGNOSIS — Z471 Aftercare following joint replacement surgery: Secondary | ICD-10-CM | POA: Diagnosis not present

## 2016-05-23 DIAGNOSIS — Z96652 Presence of left artificial knee joint: Secondary | ICD-10-CM | POA: Diagnosis not present

## 2016-05-25 DIAGNOSIS — R2689 Other abnormalities of gait and mobility: Secondary | ICD-10-CM | POA: Diagnosis not present

## 2016-05-25 DIAGNOSIS — M6281 Muscle weakness (generalized): Secondary | ICD-10-CM | POA: Diagnosis not present

## 2016-05-25 DIAGNOSIS — Z96652 Presence of left artificial knee joint: Secondary | ICD-10-CM | POA: Diagnosis not present

## 2016-05-25 DIAGNOSIS — Z471 Aftercare following joint replacement surgery: Secondary | ICD-10-CM | POA: Diagnosis not present

## 2016-05-30 DIAGNOSIS — M6281 Muscle weakness (generalized): Secondary | ICD-10-CM | POA: Diagnosis not present

## 2016-05-30 DIAGNOSIS — R2689 Other abnormalities of gait and mobility: Secondary | ICD-10-CM | POA: Diagnosis not present

## 2016-05-30 DIAGNOSIS — Z96652 Presence of left artificial knee joint: Secondary | ICD-10-CM | POA: Diagnosis not present

## 2016-05-30 DIAGNOSIS — M1712 Unilateral primary osteoarthritis, left knee: Secondary | ICD-10-CM | POA: Diagnosis not present

## 2016-05-30 DIAGNOSIS — Z471 Aftercare following joint replacement surgery: Secondary | ICD-10-CM | POA: Diagnosis not present

## 2016-05-31 DIAGNOSIS — M25562 Pain in left knee: Secondary | ICD-10-CM | POA: Diagnosis not present

## 2016-05-31 DIAGNOSIS — Z96652 Presence of left artificial knee joint: Secondary | ICD-10-CM | POA: Diagnosis not present

## 2016-05-31 DIAGNOSIS — M1712 Unilateral primary osteoarthritis, left knee: Secondary | ICD-10-CM | POA: Diagnosis not present

## 2016-05-31 DIAGNOSIS — M25662 Stiffness of left knee, not elsewhere classified: Secondary | ICD-10-CM | POA: Diagnosis not present

## 2016-06-03 DIAGNOSIS — M25562 Pain in left knee: Secondary | ICD-10-CM | POA: Diagnosis not present

## 2016-06-03 DIAGNOSIS — Z96652 Presence of left artificial knee joint: Secondary | ICD-10-CM | POA: Diagnosis not present

## 2016-06-03 DIAGNOSIS — Z471 Aftercare following joint replacement surgery: Secondary | ICD-10-CM | POA: Diagnosis not present

## 2016-06-03 DIAGNOSIS — M25662 Stiffness of left knee, not elsewhere classified: Secondary | ICD-10-CM | POA: Diagnosis not present

## 2016-06-06 DIAGNOSIS — M1712 Unilateral primary osteoarthritis, left knee: Secondary | ICD-10-CM | POA: Diagnosis not present

## 2016-06-06 DIAGNOSIS — Z96652 Presence of left artificial knee joint: Secondary | ICD-10-CM | POA: Diagnosis not present

## 2016-06-06 DIAGNOSIS — M25662 Stiffness of left knee, not elsewhere classified: Secondary | ICD-10-CM | POA: Diagnosis not present

## 2016-06-06 DIAGNOSIS — M25562 Pain in left knee: Secondary | ICD-10-CM | POA: Diagnosis not present

## 2016-06-08 DIAGNOSIS — Z96652 Presence of left artificial knee joint: Secondary | ICD-10-CM | POA: Diagnosis not present

## 2016-06-08 DIAGNOSIS — M1712 Unilateral primary osteoarthritis, left knee: Secondary | ICD-10-CM | POA: Diagnosis not present

## 2016-06-08 DIAGNOSIS — M25662 Stiffness of left knee, not elsewhere classified: Secondary | ICD-10-CM | POA: Diagnosis not present

## 2016-06-08 DIAGNOSIS — M25562 Pain in left knee: Secondary | ICD-10-CM | POA: Diagnosis not present

## 2016-06-10 DIAGNOSIS — Z96652 Presence of left artificial knee joint: Secondary | ICD-10-CM | POA: Diagnosis not present

## 2016-06-10 DIAGNOSIS — M1712 Unilateral primary osteoarthritis, left knee: Secondary | ICD-10-CM | POA: Diagnosis not present

## 2016-06-10 DIAGNOSIS — M25562 Pain in left knee: Secondary | ICD-10-CM | POA: Diagnosis not present

## 2016-06-10 DIAGNOSIS — M25662 Stiffness of left knee, not elsewhere classified: Secondary | ICD-10-CM | POA: Diagnosis not present

## 2016-06-13 DIAGNOSIS — Z96652 Presence of left artificial knee joint: Secondary | ICD-10-CM | POA: Diagnosis not present

## 2016-06-13 DIAGNOSIS — M25662 Stiffness of left knee, not elsewhere classified: Secondary | ICD-10-CM | POA: Diagnosis not present

## 2016-06-13 DIAGNOSIS — M25562 Pain in left knee: Secondary | ICD-10-CM | POA: Diagnosis not present

## 2016-06-13 DIAGNOSIS — M1712 Unilateral primary osteoarthritis, left knee: Secondary | ICD-10-CM | POA: Diagnosis not present

## 2016-06-15 DIAGNOSIS — M25662 Stiffness of left knee, not elsewhere classified: Secondary | ICD-10-CM | POA: Diagnosis not present

## 2016-06-15 DIAGNOSIS — M25562 Pain in left knee: Secondary | ICD-10-CM | POA: Diagnosis not present

## 2016-06-15 DIAGNOSIS — M1712 Unilateral primary osteoarthritis, left knee: Secondary | ICD-10-CM | POA: Diagnosis not present

## 2016-06-15 DIAGNOSIS — Z96652 Presence of left artificial knee joint: Secondary | ICD-10-CM | POA: Diagnosis not present

## 2016-06-17 DIAGNOSIS — E7800 Pure hypercholesterolemia, unspecified: Secondary | ICD-10-CM | POA: Diagnosis not present

## 2016-06-17 DIAGNOSIS — M25562 Pain in left knee: Secondary | ICD-10-CM | POA: Diagnosis not present

## 2016-06-17 DIAGNOSIS — M1712 Unilateral primary osteoarthritis, left knee: Secondary | ICD-10-CM | POA: Diagnosis not present

## 2016-06-17 DIAGNOSIS — R739 Hyperglycemia, unspecified: Secondary | ICD-10-CM | POA: Diagnosis not present

## 2016-06-17 DIAGNOSIS — M25662 Stiffness of left knee, not elsewhere classified: Secondary | ICD-10-CM | POA: Diagnosis not present

## 2016-06-17 DIAGNOSIS — Z471 Aftercare following joint replacement surgery: Secondary | ICD-10-CM | POA: Diagnosis not present

## 2016-06-17 DIAGNOSIS — I10 Essential (primary) hypertension: Secondary | ICD-10-CM | POA: Diagnosis not present

## 2016-06-17 DIAGNOSIS — N183 Chronic kidney disease, stage 3 (moderate): Secondary | ICD-10-CM | POA: Diagnosis not present

## 2016-06-20 DIAGNOSIS — M25562 Pain in left knee: Secondary | ICD-10-CM | POA: Diagnosis not present

## 2016-06-20 DIAGNOSIS — Z471 Aftercare following joint replacement surgery: Secondary | ICD-10-CM | POA: Diagnosis not present

## 2016-06-20 DIAGNOSIS — M25662 Stiffness of left knee, not elsewhere classified: Secondary | ICD-10-CM | POA: Diagnosis not present

## 2016-06-20 DIAGNOSIS — M1712 Unilateral primary osteoarthritis, left knee: Secondary | ICD-10-CM | POA: Diagnosis not present

## 2016-06-22 DIAGNOSIS — M1712 Unilateral primary osteoarthritis, left knee: Secondary | ICD-10-CM | POA: Diagnosis not present

## 2016-06-22 DIAGNOSIS — M25662 Stiffness of left knee, not elsewhere classified: Secondary | ICD-10-CM | POA: Diagnosis not present

## 2016-06-22 DIAGNOSIS — M25562 Pain in left knee: Secondary | ICD-10-CM | POA: Diagnosis not present

## 2016-06-22 DIAGNOSIS — Z96652 Presence of left artificial knee joint: Secondary | ICD-10-CM | POA: Diagnosis not present

## 2016-06-24 DIAGNOSIS — G43919 Migraine, unspecified, intractable, without status migrainosus: Secondary | ICD-10-CM | POA: Diagnosis not present

## 2016-06-24 DIAGNOSIS — I10 Essential (primary) hypertension: Secondary | ICD-10-CM | POA: Diagnosis not present

## 2016-06-24 DIAGNOSIS — M1712 Unilateral primary osteoarthritis, left knee: Secondary | ICD-10-CM | POA: Diagnosis not present

## 2016-06-24 DIAGNOSIS — E7800 Pure hypercholesterolemia, unspecified: Secondary | ICD-10-CM | POA: Diagnosis not present

## 2016-06-27 DIAGNOSIS — M1712 Unilateral primary osteoarthritis, left knee: Secondary | ICD-10-CM | POA: Diagnosis not present

## 2016-06-27 DIAGNOSIS — Z96652 Presence of left artificial knee joint: Secondary | ICD-10-CM | POA: Diagnosis not present

## 2016-06-27 DIAGNOSIS — M25562 Pain in left knee: Secondary | ICD-10-CM | POA: Diagnosis not present

## 2016-06-27 DIAGNOSIS — M25662 Stiffness of left knee, not elsewhere classified: Secondary | ICD-10-CM | POA: Diagnosis not present

## 2016-06-29 DIAGNOSIS — M25662 Stiffness of left knee, not elsewhere classified: Secondary | ICD-10-CM | POA: Diagnosis not present

## 2016-06-29 DIAGNOSIS — M1712 Unilateral primary osteoarthritis, left knee: Secondary | ICD-10-CM | POA: Diagnosis not present

## 2016-06-29 DIAGNOSIS — M25562 Pain in left knee: Secondary | ICD-10-CM | POA: Diagnosis not present

## 2016-06-29 DIAGNOSIS — Z96652 Presence of left artificial knee joint: Secondary | ICD-10-CM | POA: Diagnosis not present

## 2016-07-01 DIAGNOSIS — Z471 Aftercare following joint replacement surgery: Secondary | ICD-10-CM | POA: Diagnosis not present

## 2016-07-01 DIAGNOSIS — M25562 Pain in left knee: Secondary | ICD-10-CM | POA: Diagnosis not present

## 2016-07-01 DIAGNOSIS — Z96652 Presence of left artificial knee joint: Secondary | ICD-10-CM | POA: Diagnosis not present

## 2016-07-01 DIAGNOSIS — M1712 Unilateral primary osteoarthritis, left knee: Secondary | ICD-10-CM | POA: Diagnosis not present

## 2016-07-01 DIAGNOSIS — M25662 Stiffness of left knee, not elsewhere classified: Secondary | ICD-10-CM | POA: Diagnosis not present

## 2016-07-04 DIAGNOSIS — M25662 Stiffness of left knee, not elsewhere classified: Secondary | ICD-10-CM | POA: Diagnosis not present

## 2016-07-04 DIAGNOSIS — Z96652 Presence of left artificial knee joint: Secondary | ICD-10-CM | POA: Diagnosis not present

## 2016-07-04 DIAGNOSIS — M1712 Unilateral primary osteoarthritis, left knee: Secondary | ICD-10-CM | POA: Diagnosis not present

## 2016-07-04 DIAGNOSIS — M25562 Pain in left knee: Secondary | ICD-10-CM | POA: Diagnosis not present

## 2016-07-06 DIAGNOSIS — Z96652 Presence of left artificial knee joint: Secondary | ICD-10-CM | POA: Diagnosis not present

## 2016-07-06 DIAGNOSIS — M1712 Unilateral primary osteoarthritis, left knee: Secondary | ICD-10-CM | POA: Diagnosis not present

## 2016-07-06 DIAGNOSIS — M25562 Pain in left knee: Secondary | ICD-10-CM | POA: Diagnosis not present

## 2016-07-06 DIAGNOSIS — M25662 Stiffness of left knee, not elsewhere classified: Secondary | ICD-10-CM | POA: Diagnosis not present

## 2016-07-11 DIAGNOSIS — M25562 Pain in left knee: Secondary | ICD-10-CM | POA: Diagnosis not present

## 2016-07-11 DIAGNOSIS — M25662 Stiffness of left knee, not elsewhere classified: Secondary | ICD-10-CM | POA: Diagnosis not present

## 2016-07-11 DIAGNOSIS — M1712 Unilateral primary osteoarthritis, left knee: Secondary | ICD-10-CM | POA: Diagnosis not present

## 2016-07-11 DIAGNOSIS — Z96652 Presence of left artificial knee joint: Secondary | ICD-10-CM | POA: Diagnosis not present

## 2016-07-13 DIAGNOSIS — M25562 Pain in left knee: Secondary | ICD-10-CM | POA: Diagnosis not present

## 2016-07-13 DIAGNOSIS — M25662 Stiffness of left knee, not elsewhere classified: Secondary | ICD-10-CM | POA: Diagnosis not present

## 2016-07-13 DIAGNOSIS — Z96652 Presence of left artificial knee joint: Secondary | ICD-10-CM | POA: Diagnosis not present

## 2016-07-13 DIAGNOSIS — M1712 Unilateral primary osteoarthritis, left knee: Secondary | ICD-10-CM | POA: Diagnosis not present

## 2016-07-18 DIAGNOSIS — M1712 Unilateral primary osteoarthritis, left knee: Secondary | ICD-10-CM | POA: Diagnosis not present

## 2016-07-18 DIAGNOSIS — M25662 Stiffness of left knee, not elsewhere classified: Secondary | ICD-10-CM | POA: Diagnosis not present

## 2016-07-18 DIAGNOSIS — M25562 Pain in left knee: Secondary | ICD-10-CM | POA: Diagnosis not present

## 2016-07-18 DIAGNOSIS — Z96652 Presence of left artificial knee joint: Secondary | ICD-10-CM | POA: Diagnosis not present

## 2016-07-20 DIAGNOSIS — Z471 Aftercare following joint replacement surgery: Secondary | ICD-10-CM | POA: Diagnosis not present

## 2016-07-20 DIAGNOSIS — M25562 Pain in left knee: Secondary | ICD-10-CM | POA: Diagnosis not present

## 2016-07-20 DIAGNOSIS — Z96652 Presence of left artificial knee joint: Secondary | ICD-10-CM | POA: Diagnosis not present

## 2016-07-20 DIAGNOSIS — M25662 Stiffness of left knee, not elsewhere classified: Secondary | ICD-10-CM | POA: Diagnosis not present

## 2016-07-26 DIAGNOSIS — M25662 Stiffness of left knee, not elsewhere classified: Secondary | ICD-10-CM | POA: Diagnosis not present

## 2016-07-26 DIAGNOSIS — M25562 Pain in left knee: Secondary | ICD-10-CM | POA: Diagnosis not present

## 2016-07-26 DIAGNOSIS — M1712 Unilateral primary osteoarthritis, left knee: Secondary | ICD-10-CM | POA: Diagnosis not present

## 2016-07-26 DIAGNOSIS — Z471 Aftercare following joint replacement surgery: Secondary | ICD-10-CM | POA: Diagnosis not present

## 2016-07-28 DIAGNOSIS — M25662 Stiffness of left knee, not elsewhere classified: Secondary | ICD-10-CM | POA: Diagnosis not present

## 2016-07-28 DIAGNOSIS — M1712 Unilateral primary osteoarthritis, left knee: Secondary | ICD-10-CM | POA: Diagnosis not present

## 2016-07-28 DIAGNOSIS — M25562 Pain in left knee: Secondary | ICD-10-CM | POA: Diagnosis not present

## 2016-07-28 DIAGNOSIS — Z471 Aftercare following joint replacement surgery: Secondary | ICD-10-CM | POA: Diagnosis not present

## 2016-10-17 DIAGNOSIS — M1712 Unilateral primary osteoarthritis, left knee: Secondary | ICD-10-CM | POA: Diagnosis not present

## 2016-12-16 DIAGNOSIS — E7800 Pure hypercholesterolemia, unspecified: Secondary | ICD-10-CM | POA: Diagnosis not present

## 2016-12-16 DIAGNOSIS — I10 Essential (primary) hypertension: Secondary | ICD-10-CM | POA: Diagnosis not present

## 2016-12-16 DIAGNOSIS — Z6831 Body mass index (BMI) 31.0-31.9, adult: Secondary | ICD-10-CM | POA: Diagnosis not present

## 2016-12-16 DIAGNOSIS — H9042 Sensorineural hearing loss, unilateral, left ear, with unrestricted hearing on the contralateral side: Secondary | ICD-10-CM | POA: Diagnosis not present

## 2016-12-16 DIAGNOSIS — N183 Chronic kidney disease, stage 3 (moderate): Secondary | ICD-10-CM | POA: Diagnosis not present

## 2016-12-17 ENCOUNTER — Ambulatory Visit (INDEPENDENT_AMBULATORY_CARE_PROVIDER_SITE_OTHER): Payer: BLUE CROSS/BLUE SHIELD | Admitting: Otolaryngology

## 2016-12-19 ENCOUNTER — Ambulatory Visit (INDEPENDENT_AMBULATORY_CARE_PROVIDER_SITE_OTHER): Payer: BLUE CROSS/BLUE SHIELD | Admitting: Otolaryngology

## 2016-12-19 DIAGNOSIS — H903 Sensorineural hearing loss, bilateral: Secondary | ICD-10-CM

## 2016-12-19 DIAGNOSIS — H9222 Otorrhagia, left ear: Secondary | ICD-10-CM | POA: Diagnosis not present

## 2016-12-29 ENCOUNTER — Ambulatory Visit (INDEPENDENT_AMBULATORY_CARE_PROVIDER_SITE_OTHER): Payer: BLUE CROSS/BLUE SHIELD | Admitting: Otolaryngology

## 2016-12-29 DIAGNOSIS — H903 Sensorineural hearing loss, bilateral: Secondary | ICD-10-CM | POA: Diagnosis not present

## 2016-12-29 DIAGNOSIS — H9222 Otorrhagia, left ear: Secondary | ICD-10-CM | POA: Diagnosis not present

## 2016-12-30 DIAGNOSIS — E7800 Pure hypercholesterolemia, unspecified: Secondary | ICD-10-CM | POA: Diagnosis not present

## 2016-12-30 DIAGNOSIS — I10 Essential (primary) hypertension: Secondary | ICD-10-CM | POA: Diagnosis not present

## 2016-12-30 DIAGNOSIS — M1712 Unilateral primary osteoarthritis, left knee: Secondary | ICD-10-CM | POA: Diagnosis not present

## 2016-12-30 DIAGNOSIS — G43919 Migraine, unspecified, intractable, without status migrainosus: Secondary | ICD-10-CM | POA: Diagnosis not present

## 2017-04-06 DIAGNOSIS — M170 Bilateral primary osteoarthritis of knee: Secondary | ICD-10-CM | POA: Diagnosis not present

## 2017-07-03 DIAGNOSIS — I10 Essential (primary) hypertension: Secondary | ICD-10-CM | POA: Diagnosis not present

## 2017-07-03 DIAGNOSIS — E7800 Pure hypercholesterolemia, unspecified: Secondary | ICD-10-CM | POA: Diagnosis not present

## 2017-07-03 DIAGNOSIS — N183 Chronic kidney disease, stage 3 (moderate): Secondary | ICD-10-CM | POA: Diagnosis not present

## 2017-07-07 DIAGNOSIS — M1712 Unilateral primary osteoarthritis, left knee: Secondary | ICD-10-CM | POA: Diagnosis not present

## 2017-07-07 DIAGNOSIS — I10 Essential (primary) hypertension: Secondary | ICD-10-CM | POA: Diagnosis not present

## 2017-07-07 DIAGNOSIS — E7800 Pure hypercholesterolemia, unspecified: Secondary | ICD-10-CM | POA: Diagnosis not present

## 2017-07-07 DIAGNOSIS — G43919 Migraine, unspecified, intractable, without status migrainosus: Secondary | ICD-10-CM | POA: Diagnosis not present

## 2018-01-12 DIAGNOSIS — E7800 Pure hypercholesterolemia, unspecified: Secondary | ICD-10-CM | POA: Diagnosis not present

## 2018-01-12 DIAGNOSIS — N183 Chronic kidney disease, stage 3 (moderate): Secondary | ICD-10-CM | POA: Diagnosis not present

## 2018-01-12 DIAGNOSIS — R739 Hyperglycemia, unspecified: Secondary | ICD-10-CM | POA: Diagnosis not present

## 2018-01-12 DIAGNOSIS — I10 Essential (primary) hypertension: Secondary | ICD-10-CM | POA: Diagnosis not present

## 2018-01-19 DIAGNOSIS — G43919 Migraine, unspecified, intractable, without status migrainosus: Secondary | ICD-10-CM | POA: Diagnosis not present

## 2018-01-19 DIAGNOSIS — E7800 Pure hypercholesterolemia, unspecified: Secondary | ICD-10-CM | POA: Diagnosis not present

## 2018-01-19 DIAGNOSIS — M1712 Unilateral primary osteoarthritis, left knee: Secondary | ICD-10-CM | POA: Diagnosis not present

## 2018-01-19 DIAGNOSIS — I10 Essential (primary) hypertension: Secondary | ICD-10-CM | POA: Diagnosis not present

## 2018-07-13 DIAGNOSIS — E7800 Pure hypercholesterolemia, unspecified: Secondary | ICD-10-CM | POA: Diagnosis not present

## 2018-07-13 DIAGNOSIS — R7301 Impaired fasting glucose: Secondary | ICD-10-CM | POA: Diagnosis not present

## 2018-07-13 DIAGNOSIS — I10 Essential (primary) hypertension: Secondary | ICD-10-CM | POA: Diagnosis not present

## 2018-07-20 DIAGNOSIS — I10 Essential (primary) hypertension: Secondary | ICD-10-CM | POA: Diagnosis not present

## 2018-07-20 DIAGNOSIS — G43919 Migraine, unspecified, intractable, without status migrainosus: Secondary | ICD-10-CM | POA: Diagnosis not present

## 2018-07-20 DIAGNOSIS — E7800 Pure hypercholesterolemia, unspecified: Secondary | ICD-10-CM | POA: Diagnosis not present

## 2018-07-20 DIAGNOSIS — M1712 Unilateral primary osteoarthritis, left knee: Secondary | ICD-10-CM | POA: Diagnosis not present

## 2018-12-21 DIAGNOSIS — I10 Essential (primary) hypertension: Secondary | ICD-10-CM | POA: Diagnosis not present

## 2018-12-21 DIAGNOSIS — E7800 Pure hypercholesterolemia, unspecified: Secondary | ICD-10-CM | POA: Diagnosis not present

## 2018-12-21 DIAGNOSIS — R7301 Impaired fasting glucose: Secondary | ICD-10-CM | POA: Diagnosis not present

## 2018-12-21 DIAGNOSIS — N183 Chronic kidney disease, stage 3 (moderate): Secondary | ICD-10-CM | POA: Diagnosis not present

## 2018-12-27 DIAGNOSIS — G43919 Migraine, unspecified, intractable, without status migrainosus: Secondary | ICD-10-CM | POA: Diagnosis not present

## 2018-12-27 DIAGNOSIS — E7800 Pure hypercholesterolemia, unspecified: Secondary | ICD-10-CM | POA: Diagnosis not present

## 2018-12-27 DIAGNOSIS — I10 Essential (primary) hypertension: Secondary | ICD-10-CM | POA: Diagnosis not present

## 2018-12-27 DIAGNOSIS — M1712 Unilateral primary osteoarthritis, left knee: Secondary | ICD-10-CM | POA: Diagnosis not present

## 2019-03-13 DIAGNOSIS — A084 Viral intestinal infection, unspecified: Secondary | ICD-10-CM | POA: Diagnosis not present

## 2019-03-13 DIAGNOSIS — Z20828 Contact with and (suspected) exposure to other viral communicable diseases: Secondary | ICD-10-CM | POA: Diagnosis not present

## 2019-03-14 ENCOUNTER — Other Ambulatory Visit: Payer: Self-pay | Admitting: Internal Medicine

## 2019-03-14 DIAGNOSIS — Z20822 Contact with and (suspected) exposure to covid-19: Secondary | ICD-10-CM

## 2019-03-16 LAB — NOVEL CORONAVIRUS, NAA: SARS-CoV-2, NAA: NOT DETECTED

## 2019-06-21 DIAGNOSIS — R7301 Impaired fasting glucose: Secondary | ICD-10-CM | POA: Diagnosis not present

## 2019-06-21 DIAGNOSIS — I10 Essential (primary) hypertension: Secondary | ICD-10-CM | POA: Diagnosis not present

## 2019-06-21 DIAGNOSIS — E7800 Pure hypercholesterolemia, unspecified: Secondary | ICD-10-CM | POA: Diagnosis not present

## 2019-06-25 DIAGNOSIS — E7800 Pure hypercholesterolemia, unspecified: Secondary | ICD-10-CM | POA: Diagnosis not present

## 2019-06-25 DIAGNOSIS — Z1331 Encounter for screening for depression: Secondary | ICD-10-CM | POA: Diagnosis not present

## 2019-06-25 DIAGNOSIS — N183 Chronic kidney disease, stage 3 unspecified: Secondary | ICD-10-CM | POA: Diagnosis not present

## 2019-06-25 DIAGNOSIS — I10 Essential (primary) hypertension: Secondary | ICD-10-CM | POA: Diagnosis not present

## 2019-06-25 DIAGNOSIS — G43919 Migraine, unspecified, intractable, without status migrainosus: Secondary | ICD-10-CM | POA: Diagnosis not present

## 2019-06-25 DIAGNOSIS — Z1389 Encounter for screening for other disorder: Secondary | ICD-10-CM | POA: Diagnosis not present

## 2019-12-13 DIAGNOSIS — R739 Hyperglycemia, unspecified: Secondary | ICD-10-CM | POA: Diagnosis not present

## 2019-12-13 DIAGNOSIS — I10 Essential (primary) hypertension: Secondary | ICD-10-CM | POA: Diagnosis not present

## 2019-12-13 DIAGNOSIS — E7800 Pure hypercholesterolemia, unspecified: Secondary | ICD-10-CM | POA: Diagnosis not present

## 2019-12-20 DIAGNOSIS — G43919 Migraine, unspecified, intractable, without status migrainosus: Secondary | ICD-10-CM | POA: Diagnosis not present

## 2019-12-20 DIAGNOSIS — I10 Essential (primary) hypertension: Secondary | ICD-10-CM | POA: Diagnosis not present

## 2019-12-20 DIAGNOSIS — M1712 Unilateral primary osteoarthritis, left knee: Secondary | ICD-10-CM | POA: Diagnosis not present

## 2019-12-20 DIAGNOSIS — E7800 Pure hypercholesterolemia, unspecified: Secondary | ICD-10-CM | POA: Diagnosis not present

## 2020-06-12 DIAGNOSIS — E7800 Pure hypercholesterolemia, unspecified: Secondary | ICD-10-CM | POA: Diagnosis not present

## 2020-06-12 DIAGNOSIS — I10 Essential (primary) hypertension: Secondary | ICD-10-CM | POA: Diagnosis not present

## 2020-06-12 DIAGNOSIS — R7301 Impaired fasting glucose: Secondary | ICD-10-CM | POA: Diagnosis not present

## 2020-06-19 DIAGNOSIS — G43919 Migraine, unspecified, intractable, without status migrainosus: Secondary | ICD-10-CM | POA: Diagnosis not present

## 2020-06-19 DIAGNOSIS — E7800 Pure hypercholesterolemia, unspecified: Secondary | ICD-10-CM | POA: Diagnosis not present

## 2020-06-19 DIAGNOSIS — I10 Essential (primary) hypertension: Secondary | ICD-10-CM | POA: Diagnosis not present

## 2020-06-19 DIAGNOSIS — M1712 Unilateral primary osteoarthritis, left knee: Secondary | ICD-10-CM | POA: Diagnosis not present

## 2020-09-22 DIAGNOSIS — Z01812 Encounter for preprocedural laboratory examination: Secondary | ICD-10-CM | POA: Diagnosis not present

## 2020-09-22 DIAGNOSIS — M1711 Unilateral primary osteoarthritis, right knee: Secondary | ICD-10-CM | POA: Diagnosis not present

## 2020-09-22 DIAGNOSIS — M25561 Pain in right knee: Secondary | ICD-10-CM | POA: Diagnosis not present

## 2020-09-29 DIAGNOSIS — M1710 Unilateral primary osteoarthritis, unspecified knee: Secondary | ICD-10-CM | POA: Diagnosis not present

## 2020-09-29 DIAGNOSIS — Z6831 Body mass index (BMI) 31.0-31.9, adult: Secondary | ICD-10-CM | POA: Diagnosis not present

## 2020-10-01 DIAGNOSIS — M1711 Unilateral primary osteoarthritis, right knee: Secondary | ICD-10-CM | POA: Diagnosis not present

## 2020-10-14 DIAGNOSIS — M1711 Unilateral primary osteoarthritis, right knee: Secondary | ICD-10-CM | POA: Diagnosis not present

## 2020-11-13 DIAGNOSIS — Z96651 Presence of right artificial knee joint: Secondary | ICD-10-CM | POA: Insufficient documentation

## 2020-11-24 DIAGNOSIS — I10 Essential (primary) hypertension: Secondary | ICD-10-CM | POA: Diagnosis not present

## 2020-11-24 DIAGNOSIS — Z6830 Body mass index (BMI) 30.0-30.9, adult: Secondary | ICD-10-CM | POA: Diagnosis not present

## 2020-12-11 DIAGNOSIS — N183 Chronic kidney disease, stage 3 unspecified: Secondary | ICD-10-CM | POA: Diagnosis not present

## 2020-12-11 DIAGNOSIS — E7800 Pure hypercholesterolemia, unspecified: Secondary | ICD-10-CM | POA: Diagnosis not present

## 2020-12-11 DIAGNOSIS — R7301 Impaired fasting glucose: Secondary | ICD-10-CM | POA: Diagnosis not present

## 2020-12-11 DIAGNOSIS — I10 Essential (primary) hypertension: Secondary | ICD-10-CM | POA: Diagnosis not present

## 2020-12-21 DIAGNOSIS — Z1331 Encounter for screening for depression: Secondary | ICD-10-CM | POA: Diagnosis not present

## 2020-12-21 DIAGNOSIS — R7301 Impaired fasting glucose: Secondary | ICD-10-CM | POA: Diagnosis not present

## 2020-12-21 DIAGNOSIS — G43919 Migraine, unspecified, intractable, without status migrainosus: Secondary | ICD-10-CM | POA: Diagnosis not present

## 2020-12-21 DIAGNOSIS — I10 Essential (primary) hypertension: Secondary | ICD-10-CM | POA: Diagnosis not present

## 2020-12-21 DIAGNOSIS — E7801 Familial hypercholesterolemia: Secondary | ICD-10-CM | POA: Diagnosis not present

## 2020-12-21 DIAGNOSIS — Z1389 Encounter for screening for other disorder: Secondary | ICD-10-CM | POA: Diagnosis not present

## 2021-06-11 DIAGNOSIS — N183 Chronic kidney disease, stage 3 unspecified: Secondary | ICD-10-CM | POA: Diagnosis not present

## 2021-06-11 DIAGNOSIS — R7301 Impaired fasting glucose: Secondary | ICD-10-CM | POA: Diagnosis not present

## 2021-06-11 DIAGNOSIS — E7800 Pure hypercholesterolemia, unspecified: Secondary | ICD-10-CM | POA: Diagnosis not present

## 2021-06-18 DIAGNOSIS — I10 Essential (primary) hypertension: Secondary | ICD-10-CM | POA: Diagnosis not present

## 2021-06-18 DIAGNOSIS — M1712 Unilateral primary osteoarthritis, left knee: Secondary | ICD-10-CM | POA: Diagnosis not present

## 2021-06-18 DIAGNOSIS — G43919 Migraine, unspecified, intractable, without status migrainosus: Secondary | ICD-10-CM | POA: Diagnosis not present

## 2021-06-18 DIAGNOSIS — Z7189 Other specified counseling: Secondary | ICD-10-CM | POA: Diagnosis not present

## 2021-06-18 DIAGNOSIS — E7801 Familial hypercholesterolemia: Secondary | ICD-10-CM | POA: Diagnosis not present

## 2021-07-16 DIAGNOSIS — M170 Bilateral primary osteoarthritis of knee: Secondary | ICD-10-CM | POA: Diagnosis not present

## 2021-07-16 DIAGNOSIS — M1711 Unilateral primary osteoarthritis, right knee: Secondary | ICD-10-CM | POA: Diagnosis not present

## 2021-07-19 DIAGNOSIS — Z1212 Encounter for screening for malignant neoplasm of rectum: Secondary | ICD-10-CM | POA: Diagnosis not present

## 2021-07-19 DIAGNOSIS — Z1211 Encounter for screening for malignant neoplasm of colon: Secondary | ICD-10-CM | POA: Diagnosis not present

## 2021-08-02 LAB — EXTERNAL GENERIC LAB PROCEDURE: COLOGUARD: NEGATIVE

## 2021-09-03 ENCOUNTER — Encounter (HOSPITAL_COMMUNITY): Payer: Self-pay | Admitting: *Deleted

## 2021-09-03 ENCOUNTER — Ambulatory Visit (HOSPITAL_COMMUNITY): Admit: 2021-09-03 | Payer: BC Managed Care – PPO | Admitting: Cardiovascular Disease

## 2021-09-03 ENCOUNTER — Other Ambulatory Visit: Payer: Self-pay

## 2021-09-03 ENCOUNTER — Inpatient Hospital Stay (HOSPITAL_BASED_OUTPATIENT_CLINIC_OR_DEPARTMENT_OTHER): Payer: BC Managed Care – PPO

## 2021-09-03 ENCOUNTER — Encounter (HOSPITAL_COMMUNITY)
Admission: EM | Disposition: A | Discharge status: Home / Self Care | Payer: Self-pay | Source: Home / Self Care | Attending: Internal Medicine

## 2021-09-03 ENCOUNTER — Inpatient Hospital Stay (HOSPITAL_COMMUNITY)
Admission: EM | Admit: 2021-09-03 | Discharge: 2021-09-04 | DRG: 247 | Disposition: A | Discharge status: Home / Self Care | Payer: BC Managed Care – PPO | Attending: Internal Medicine | Admitting: Internal Medicine

## 2021-09-03 ENCOUNTER — Emergency Department (HOSPITAL_COMMUNITY): Payer: BC Managed Care – PPO

## 2021-09-03 DIAGNOSIS — I2510 Atherosclerotic heart disease of native coronary artery without angina pectoris: Secondary | ICD-10-CM | POA: Diagnosis not present

## 2021-09-03 DIAGNOSIS — N179 Acute kidney failure, unspecified: Secondary | ICD-10-CM | POA: Diagnosis not present

## 2021-09-03 DIAGNOSIS — M1990 Unspecified osteoarthritis, unspecified site: Secondary | ICD-10-CM | POA: Diagnosis not present

## 2021-09-03 DIAGNOSIS — E785 Hyperlipidemia, unspecified: Secondary | ICD-10-CM | POA: Diagnosis present

## 2021-09-03 DIAGNOSIS — Z96652 Presence of left artificial knee joint: Secondary | ICD-10-CM | POA: Diagnosis present

## 2021-09-03 DIAGNOSIS — Z8249 Family history of ischemic heart disease and other diseases of the circulatory system: Secondary | ICD-10-CM

## 2021-09-03 DIAGNOSIS — Z888 Allergy status to other drugs, medicaments and biological substances status: Secondary | ICD-10-CM | POA: Diagnosis not present

## 2021-09-03 DIAGNOSIS — I10 Essential (primary) hypertension: Secondary | ICD-10-CM | POA: Diagnosis not present

## 2021-09-03 DIAGNOSIS — I214 Non-ST elevation (NSTEMI) myocardial infarction: Secondary | ICD-10-CM | POA: Diagnosis not present

## 2021-09-03 DIAGNOSIS — Z79899 Other long term (current) drug therapy: Secondary | ICD-10-CM

## 2021-09-03 DIAGNOSIS — E669 Obesity, unspecified: Secondary | ICD-10-CM | POA: Diagnosis not present

## 2021-09-03 DIAGNOSIS — Z6831 Body mass index (BMI) 31.0-31.9, adult: Secondary | ICD-10-CM

## 2021-09-03 DIAGNOSIS — Z7901 Long term (current) use of anticoagulants: Secondary | ICD-10-CM | POA: Diagnosis not present

## 2021-09-03 DIAGNOSIS — R079 Chest pain, unspecified: Secondary | ICD-10-CM

## 2021-09-03 DIAGNOSIS — R778 Other specified abnormalities of plasma proteins: Secondary | ICD-10-CM

## 2021-09-03 DIAGNOSIS — Z20822 Contact with and (suspected) exposure to covid-19: Secondary | ICD-10-CM | POA: Diagnosis not present

## 2021-09-03 DIAGNOSIS — Z955 Presence of coronary angioplasty implant and graft: Secondary | ICD-10-CM

## 2021-09-03 HISTORY — PX: LEFT HEART CATH AND CORONARY ANGIOGRAPHY: CATH118249

## 2021-09-03 HISTORY — PX: CORONARY/GRAFT ACUTE MI REVASCULARIZATION: CATH118305

## 2021-09-03 LAB — CBC
HCT: 45.3 % (ref 39.0–52.0)
Hemoglobin: 15.5 g/dL (ref 13.0–17.0)
MCH: 28.7 pg (ref 26.0–34.0)
MCHC: 34.2 g/dL (ref 30.0–36.0)
MCV: 83.9 fL (ref 80.0–100.0)
Platelets: 201 10*3/uL (ref 150–400)
RBC: 5.4 MIL/uL (ref 4.22–5.81)
RDW: 12.3 % (ref 11.5–15.5)
WBC: 7.1 10*3/uL (ref 4.0–10.5)
nRBC: 0 % (ref 0.0–0.2)

## 2021-09-03 LAB — BRAIN NATRIURETIC PEPTIDE: B Natriuretic Peptide: 65 pg/mL (ref 0.0–100.0)

## 2021-09-03 LAB — BASIC METABOLIC PANEL
Anion gap: 8 (ref 5–15)
BUN: 19 mg/dL (ref 6–20)
CO2: 26 mmol/L (ref 22–32)
Calcium: 9.7 mg/dL (ref 8.9–10.3)
Chloride: 103 mmol/L (ref 98–111)
Creatinine, Ser: 1.24 mg/dL (ref 0.61–1.24)
GFR, Estimated: >60 mL/min (ref >60)
Glucose, Bld: 108 mg/dL — ABNORMAL HIGH (ref 70–99)
Potassium: 3.6 mmol/L (ref 3.5–5.1)
Sodium: 137 mmol/L (ref 135–145)

## 2021-09-03 LAB — LIPID PANEL
Cholesterol: 225 mg/dL — ABNORMAL HIGH (ref 0–200)
HDL: 29 mg/dL — ABNORMAL LOW (ref >40)
LDL Cholesterol: 161 mg/dL — ABNORMAL HIGH (ref 0–99)
Total CHOL/HDL Ratio: 7.8 RATIO
Triglycerides: 177 mg/dL — ABNORMAL HIGH (ref <150)
VLDL: 35 mg/dL (ref 0–40)

## 2021-09-03 LAB — RESP PANEL BY RT-PCR (FLU A&B, COVID) ARPGX2
Influenza A by PCR: NEGATIVE
Influenza B by PCR: NEGATIVE
SARS Coronavirus 2 by RT PCR: NEGATIVE

## 2021-09-03 LAB — HIV ANTIBODY (ROUTINE TESTING W REFLEX): HIV Screen 4th Generation wRfx: NONREACTIVE

## 2021-09-03 LAB — APTT: aPTT: 30 seconds (ref 24–36)

## 2021-09-03 LAB — TROPONIN I (HIGH SENSITIVITY)
Troponin I (High Sensitivity): 12799 ng/L (ref <18)
Troponin I (High Sensitivity): 12903 ng/L (ref <18)

## 2021-09-03 LAB — ECHOCARDIOGRAM COMPLETE
Area-P 1/2: 2.99 cm2
Height: 72 in
S' Lateral: 3.3 cm
Weight: 3760 oz

## 2021-09-03 LAB — HEPARIN LEVEL (UNFRACTIONATED): Heparin Unfractionated: <0.1 IU/mL — ABNORMAL LOW (ref 0.30–0.70)

## 2021-09-03 LAB — POCT ACTIVATED CLOTTING TIME
Activated Clotting Time: 311 seconds
Activated Clotting Time: 317 seconds
Activated Clotting Time: 155 seconds

## 2021-09-03 SURGERY — LEFT HEART CATH AND CORONARY ANGIOGRAPHY
Anesthesia: LOCAL

## 2021-09-03 MED ORDER — HEPARIN BOLUS VIA INFUSION
4000.0000 [IU] | Freq: Once | INTRAVENOUS | Status: AC
  Administered 2021-09-03: 4000 [IU] via INTRAVENOUS

## 2021-09-03 MED ORDER — FENTANYL CITRATE (PF) 100 MCG/2ML IJ SOLN
INTRAMUSCULAR | Status: DC | PRN
  Administered 2021-09-03: 50 ug via INTRAVENOUS

## 2021-09-03 MED ORDER — ALUM & MAG HYDROXIDE-SIMETH 200-200-20 MG/5ML PO SUSP
30.0000 mL | Freq: Once | ORAL | Status: AC
  Administered 2021-09-03: 30 mL via ORAL
  Filled 2021-09-03: qty 30

## 2021-09-03 MED ORDER — HEPARIN SODIUM (PORCINE) 1000 UNIT/ML IJ SOLN
INTRAMUSCULAR | Status: DC | PRN
  Administered 2021-09-03: 12000 [IU] via INTRAVENOUS

## 2021-09-03 MED ORDER — ACETAMINOPHEN 325 MG PO TABS: 650.0000 mg | ORAL_TABLET | ORAL | Status: DC | PRN

## 2021-09-03 MED ORDER — HEPARIN SODIUM (PORCINE) 1000 UNIT/ML IJ SOLN
INTRAMUSCULAR | Status: AC
  Filled 2021-09-03: qty 10

## 2021-09-03 MED ORDER — METOPROLOL TARTRATE 25 MG PO TABS
25.0000 mg | ORAL_TABLET | Freq: Two times a day (BID) | ORAL | Status: DC
  Administered 2021-09-03 – 2021-09-04 (×2): 25 mg via ORAL
  Filled 2021-09-03 (×2): qty 1

## 2021-09-03 MED ORDER — LABETALOL HCL 5 MG/ML IV SOLN: 10.0000 mg | INTRAVENOUS | Status: AC | PRN

## 2021-09-03 MED ORDER — VERAPAMIL HCL 2.5 MG/ML IV SOLN
INTRAVENOUS | Status: DC | PRN
  Administered 2021-09-03: 10 mL via INTRA_ARTERIAL

## 2021-09-03 MED ORDER — HEPARIN (PORCINE) IN NACL 1000-0.9 UT/500ML-% IV SOLN
INTRAVENOUS | Status: AC
  Filled 2021-09-03: qty 1000

## 2021-09-03 MED ORDER — ONDANSETRON HCL 4 MG/2ML IJ SOLN
4.0000 mg | Freq: Once | INTRAMUSCULAR | Status: AC
  Administered 2021-09-03: 4 mg via INTRAVENOUS
  Filled 2021-09-03: qty 2

## 2021-09-03 MED ORDER — NITROGLYCERIN IN D5W 200-5 MCG/ML-% IV SOLN
5.0000 ug/min | INTRAVENOUS | Status: DC
  Administered 2021-09-03: 5 ug/min via INTRAVENOUS
  Filled 2021-09-03: qty 250

## 2021-09-03 MED ORDER — SODIUM CHLORIDE 0.9% FLUSH
3.0000 mL | Freq: Two times a day (BID) | INTRAVENOUS | Status: DC
  Administered 2021-09-03 – 2021-09-04 (×2): 3 mL via INTRAVENOUS

## 2021-09-03 MED ORDER — ASPIRIN EC 81 MG PO TBEC
81.0000 mg | DELAYED_RELEASE_TABLET | Freq: Every day | ORAL | Status: DC
  Administered 2021-09-04: 81 mg via ORAL
  Filled 2021-09-03: qty 1

## 2021-09-03 MED ORDER — TIROFIBAN HCL IN NACL 5-0.9 MG/100ML-% IV SOLN
INTRAVENOUS | Status: DC | PRN
  Administered 2021-09-03: .15 ug/kg/min via INTRAVENOUS

## 2021-09-03 MED ORDER — TICAGRELOR 90 MG PO TABS
ORAL_TABLET | ORAL | Status: AC
  Filled 2021-09-03: qty 2

## 2021-09-03 MED ORDER — HYDRALAZINE HCL 20 MG/ML IJ SOLN: 10.0000 mg | INTRAMUSCULAR | Status: AC | PRN

## 2021-09-03 MED ORDER — MORPHINE SULFATE (PF) 4 MG/ML IV SOLN
4.0000 mg | Freq: Once | INTRAVENOUS | Status: AC
  Administered 2021-09-03: 4 mg via INTRAVENOUS
  Filled 2021-09-03: qty 1

## 2021-09-03 MED ORDER — TICAGRELOR 90 MG PO TABS
ORAL_TABLET | ORAL | Status: DC | PRN
  Administered 2021-09-03: 180 mg via ORAL

## 2021-09-03 MED ORDER — VERAPAMIL HCL 2.5 MG/ML IV SOLN
INTRAVENOUS | Status: AC
  Filled 2021-09-03: qty 2

## 2021-09-03 MED ORDER — HEPARIN (PORCINE) 25000 UT/250ML-% IV SOLN
1200.0000 [IU]/h | INTRAVENOUS | Status: DC
  Administered 2021-09-03: 1200 [IU]/h via INTRAVENOUS
  Filled 2021-09-03: qty 250

## 2021-09-03 MED ORDER — MIDAZOLAM HCL 2 MG/2ML IJ SOLN
INTRAMUSCULAR | Status: AC
  Filled 2021-09-03: qty 2

## 2021-09-03 MED ORDER — SODIUM CHLORIDE 0.9 % IV SOLN: 250.0000 mL | INTRAVENOUS | Status: DC | PRN

## 2021-09-03 MED ORDER — ATORVASTATIN CALCIUM 80 MG PO TABS
80.0000 mg | ORAL_TABLET | Freq: Every day | ORAL | Status: DC
  Administered 2021-09-03 – 2021-09-04 (×2): 80 mg via ORAL
  Filled 2021-09-03: qty 1
  Filled 2021-09-03: qty 2

## 2021-09-03 MED ORDER — SODIUM CHLORIDE 0.9 % IV SOLN: INTRAVENOUS | Status: AC

## 2021-09-03 MED ORDER — NITROGLYCERIN 1 MG/10 ML FOR IR/CATH LAB
INTRA_ARTERIAL | Status: AC
  Filled 2021-09-03: qty 10

## 2021-09-03 MED ORDER — SODIUM CHLORIDE 0.9% FLUSH: 3.0000 mL | INTRAVENOUS | Status: DC | PRN

## 2021-09-03 MED ORDER — FENTANYL CITRATE (PF) 100 MCG/2ML IJ SOLN
INTRAMUSCULAR | Status: AC
  Filled 2021-09-03: qty 2

## 2021-09-03 MED ORDER — ONDANSETRON HCL 4 MG/2ML IJ SOLN: 4.0000 mg | Freq: Four times a day (QID) | INTRAMUSCULAR | Status: DC | PRN

## 2021-09-03 MED ORDER — NITROGLYCERIN 1 MG/10 ML FOR IR/CATH LAB
INTRA_ARTERIAL | Status: DC | PRN
  Administered 2021-09-03: 200 ug via INTRACORONARY

## 2021-09-03 MED ORDER — ATROPINE SULFATE 1 MG/10ML IJ SOSY
PREFILLED_SYRINGE | INTRAMUSCULAR | Status: AC
  Filled 2021-09-03: qty 10

## 2021-09-03 MED ORDER — HEPARIN SODIUM (PORCINE) 5000 UNIT/ML IJ SOLN: 5000.0000 [IU] | Freq: Three times a day (TID) | INTRAMUSCULAR | Status: DC

## 2021-09-03 MED ORDER — ONDANSETRON HCL 4 MG/2ML IJ SOLN
4.0000 mg | Freq: Four times a day (QID) | INTRAMUSCULAR | Status: DC | PRN
  Administered 2021-09-03: 4 mg via INTRAVENOUS
  Filled 2021-09-03: qty 2

## 2021-09-03 MED ORDER — HEPARIN (PORCINE) IN NACL 1000-0.9 UT/500ML-% IV SOLN
INTRAVENOUS | Status: DC | PRN
  Administered 2021-09-03 (×2): 500 mL

## 2021-09-03 MED ORDER — TIROFIBAN (AGGRASTAT) BOLUS VIA INFUSION
INTRAVENOUS | Status: DC | PRN
  Administered 2021-09-03: 2665 ug via INTRAVENOUS

## 2021-09-03 MED ORDER — SODIUM CHLORIDE 0.9 % IV SOLN: INTRAVENOUS | Status: DC

## 2021-09-03 MED ORDER — LIDOCAINE HCL (PF) 1 % IJ SOLN
INTRAMUSCULAR | Status: DC | PRN
  Administered 2021-09-03: 2 mL
  Administered 2021-09-03: 12 mL

## 2021-09-03 MED ORDER — MIDAZOLAM HCL 2 MG/2ML IJ SOLN
INTRAMUSCULAR | Status: DC | PRN
  Administered 2021-09-03: 2 mg via INTRAVENOUS

## 2021-09-03 MED ORDER — ASPIRIN 81 MG PO CHEW: 324.0000 mg | CHEWABLE_TABLET | ORAL | Status: DC

## 2021-09-03 MED ORDER — IOHEXOL 350 MG/ML SOLN
INTRAVENOUS | Status: DC | PRN
  Administered 2021-09-03: 140 mL

## 2021-09-03 MED ORDER — ASPIRIN 300 MG RE SUPP: 300.0000 mg | RECTAL | Status: DC

## 2021-09-03 MED ORDER — LIDOCAINE HCL (PF) 1 % IJ SOLN
INTRAMUSCULAR | Status: AC
  Filled 2021-09-03: qty 30

## 2021-09-03 MED ORDER — TICAGRELOR 90 MG PO TABS
90.0000 mg | ORAL_TABLET | Freq: Two times a day (BID) | ORAL | Status: DC
  Administered 2021-09-04: 90 mg via ORAL
  Filled 2021-09-03 (×2): qty 1

## 2021-09-03 MED ORDER — NITROGLYCERIN 0.4 MG SL SUBL: 0.4000 mg | SUBLINGUAL_TABLET | SUBLINGUAL | Status: DC | PRN

## 2021-09-03 MED ORDER — LIDOCAINE VISCOUS HCL 2 % MT SOLN
15.0000 mL | Freq: Once | OROMUCOSAL | Status: AC
  Administered 2021-09-03: 15 mL via ORAL
  Filled 2021-09-03: qty 15

## 2021-09-03 MED ORDER — PERFLUTREN LIPID MICROSPHERE
1.0000 mL | INTRAVENOUS | Status: AC | PRN
  Administered 2021-09-03: 2 mL via INTRAVENOUS
  Filled 2021-09-03: qty 10

## 2021-09-03 MED ORDER — ASPIRIN 325 MG PO TABS
325.0000 mg | ORAL_TABLET | Freq: Every day | ORAL | Status: DC
  Administered 2021-09-03: 325 mg via ORAL
  Filled 2021-09-03: qty 1

## 2021-09-03 SURGICAL SUPPLY — 26 items
BALLN SAPPHIRE 2.5X12 (BALLOONS) ×2
BALLN ~~LOC~~ EUPHORA RX 4.0X15 (BALLOONS) ×2
BALLOON SAPPHIRE 2.5X12 (BALLOONS) IMPLANT
BALLOON ~~LOC~~ EUPHORA RX 4.0X15 (BALLOONS) IMPLANT
CATH 5FR JL3.5 JR4 ANG PIG MP (CATHETERS) ×1 IMPLANT
CATH INFINITI 5FR JL4 (CATHETERS) ×1 IMPLANT
CATH LAUNCHER 6FR AL.75 (CATHETERS) ×1 IMPLANT
CATH LAUNCHER 6FR JR4 (CATHETERS) IMPLANT
DEVICE RAD COMP TR BAND LRG (VASCULAR PRODUCTS) ×1 IMPLANT
GLIDESHEATH SLEND SS 6F .021 (SHEATH) ×1 IMPLANT
GUIDEWIRE INQWIRE 1.5J.035X260 (WIRE) IMPLANT
INQWIRE 1.5J .035X260CM (WIRE) ×2
KIT ENCORE 26 ADVANTAGE (KITS) ×1 IMPLANT
KIT HEART LEFT (KITS) ×2 IMPLANT
KIT MICROPUNCTURE NIT STIFF (SHEATH) ×1 IMPLANT
PACK CARDIAC CATHETERIZATION (CUSTOM PROCEDURE TRAY) ×2 IMPLANT
SHEATH PINNACLE 5F 10CM (SHEATH) ×1 IMPLANT
SHEATH PINNACLE 6F 10CM (SHEATH) ×1 IMPLANT
SHEATH PROBE COVER 6X72 (BAG) ×1 IMPLANT
STENT SYNERGY XD 4.0X28 (Permanent Stent) IMPLANT
SYNERGY XD 4.0X28 (Permanent Stent) ×2 IMPLANT
TRANSDUCER W/STOPCOCK (MISCELLANEOUS) ×2 IMPLANT
TUBING CIL FLEX 10 FLL-RA (TUBING) ×2 IMPLANT
WIRE COUGAR XT STRL 190CM (WIRE) ×1 IMPLANT
WIRE EMERALD 3MM-J .035X150CM (WIRE) ×1 IMPLANT
WIRE HI TORQ WHISPER MS 190CM (WIRE) ×1 IMPLANT

## 2021-09-03 NOTE — Interval H&P Note (Signed)
History and Physical Interval Note:  09/03/2021 4:04 PM  Jason [name]  has presented today for surgery, with the diagnosis of NSTEMI.  The various methods of treatment have been discussed with the patient and family. After consideration of risks, benefits and other options for treatment, the patient has consented to  Procedure(s): LEFT HEART CATH AND CORONARY ANGIOGRAPHY (N/A) as a surgical intervention.  The patient's history has been reviewed, patient examined, no change in status, stable for surgery.  I have reviewed the patient's chart and labs.  Questions were answered to the patient's satisfaction.    Cath Lab Visit (complete for each Cath Lab visit)  Clinical Evaluation Leading to the Procedure:   ACS: Yes.    Non-ACS:    Anginal Classification: CCS III  Anti-ischemic medical therapy: No Therapy  Non-Invasive Test Results: No non-invasive testing performed  Prior CABG: No previous CABG        [name]

## 2021-09-03 NOTE — Progress Notes (Signed)
*  PRELIMINARY RESULTS* Echocardiogram 2D Echocardiogram has been performed with Definity.  Stacey Drain 09/03/2021, 2:36 PM

## 2021-09-03 NOTE — Progress Notes (Signed)
ANTICOAGULATION CONSULT NOTE - Initial Consult  Pharmacy Consult for Heparin Indication: chest pain/ACS  Allergies  Allergen Reactions   Lisinopril    No Known Allergies     Patient Measurements: Height: 6' (182.9 cm) Weight: 106.6 kg (235 lb) IBW/kg (Calculated) : 77.6 HEPARIN DW (KG): 99.9   Vital Signs: Temp: 97.9 F (36.6 C) (02/03 1119) Temp Source: Oral (02/03 1119) BP: 169/105 (02/03 1230) Pulse Rate: 80 (02/03 1230)  Labs: Recent Labs    09/03/21 1144  HGB 15.5  HCT 45.3  PLT 201  CREATININE 1.24  TROPONINIHS 12,799*    Estimated Creatinine Clearance: 81.9 mL/min (by C-G formula based on SCr of 1.24 mg/dL).   Medical History: Past Medical History:  Diagnosis Date   Arthritis    Headache    migraines    last one "been awhile" 6 mths or so   HTN (hypertension)    Hyperlipidemia     Medications:  See med rec  Assessment: Patient presented with chest pain that radiated down both arms. Elevated troponins. Patient reported that he is not on oral anticoagulants. In addition, recently taken off ASA in November. Pharmacy asked to start heparin.   Goal of Therapy:  Heparin level 0.3-0.7 units/ml Monitor platelets by anticoagulation protocol: Yes   Plan:  Give 4000 units bolus x 1 Start heparin infusion at 1200 units/hr Check anti-Xa level in ~6 hours and daily while on heparin Continue to monitor H&H and platelets  Elder Cyphers, BS Loura Back, BCPS Clinical Pharmacist Pager #[phone] 09/03/2021,12:56 PM

## 2021-09-03 NOTE — H&P (Addendum)
Cardiology Admission History and Physical:   Patient ID: Jason [name] MRN: [id number]; DOB: 1960-08-16   Admission date: 09/03/2021  PCP:  Manon Hilding, MD   [facility] Providers Cardiologist:  New Chief Complaint:  Chest pain , elevated troponin  Patient Profile:   Jason [name] is a 61 y.o. male with HTN  who is being seen 09/03/2021 for the evaluation of chest pain, elevated troponin.  History of Present Illness:   Jason [name] is a 61 yo with hx of HTN, HL   presented to [facility] ED with HTN The pt says over the past month he has noticed increased SOB with exertion.   A couple days ago he had intermittent chest pains   Mild   (2/10)  Ease on own    Last evening before bed got a severe CP   (8-9/10)  Not pleuritic   Radiated to L arm and elbow.  + SOB   Could not lay down    Sat up most of night and discomfort eventually eased   Went to work with mild discomfort   Called PCP   Spoke to nurse since MD not in   Told to go to ED     Currently patient is CP free  Hx HTN   BP has not been optimal he says    Past Medical History:  Diagnosis Date   Arthritis    Headache    migraines    last one "been awhile" 6 mths or so   HTN (hypertension)    Hyperlipidemia     Past Surgical History:  Procedure Laterality Date   KNEE ARTHROSCOPY Left    3 on left and 1 on the right   TONSILLECTOMY     TOTAL KNEE ARTHROPLASTY Left 04/01/2016   Procedure: TOTAL KNEE ARTHROPLASTY;  Surgeon: Earlie Server, MD;  Location: [facility];  Service: Orthopedics;  Laterality: Left;     Medications Prior to Admission: Prior to Admission medications   Medication Sig Start Date End Date Taking? Authorizing Provider  olmesartan-hydrochlorothiazide (BENICAR HCT) 40-25 MG tablet Take 1 tablet by mouth daily. 07/19/21  Yes [provider]  Omega-3 Fatty Acids (FISH OIL) 1200 MG CAPS Take 1,200 mg by mouth daily.   Yes [provider]  apixaban (ELIQUIS) 2.5 MG TABS tablet Take 1 tablet (2.5  mg total) by mouth 2 (two) times daily. Patient not taking: Reported on 09/03/2021 04/01/16   Chadwell, Vonna Kotyk, PA-C  oxyCODONE-acetaminophen (ROXICET) 5-325 MG tablet Take 1-2 tablets by mouth every 4 (four) hours as needed for moderate pain or severe pain. Patient not taking: Reported on 09/03/2021 04/01/16   Chadwell, Vonna Kotyk, PA-C  polyethylene glycol (MIRALAX / GLYCOLAX) packet Take 17 g by mouth daily as needed for mild constipation. Patient not taking: Reported on 09/03/2021 04/02/16   Ainsley Spinner, PA-C     Allergies:    Allergies  Allergen Reactions   Lisinopril    No Known Allergies     Social History:   Social History   Socioeconomic History   Marital status: Unknown    Spouse name: Not on file   Number of children: Not on file   Years of education: Not on file   Highest education level: Not on file  Occupational History   Not on file  Tobacco Use   Smoking status: Never   Smokeless tobacco: Never  Substance and Sexual Activity   Alcohol use: Yes    Alcohol/week: 10.0 standard drinks    Types:  10 Cans of beer per week   Drug use: No   Sexual activity: Not on file  Other Topics Concern   Not on file  Social History Narrative   Not on file   Social Determinants of Health   Financial Resource Strain: Not on file  Food Insecurity: Not on file  Transportation Needs: Not on file  Physical Activity: Not on file  Stress: Not on file  Social Connections: Not on file  Intimate Partner Violence: Not on file    Family History:   The patient's   maternal uncle with CAD   Otherwise neg for CAD  ROS:  Please see the history of present illness.  All other ROS reviewed and negative.     Physical Exam/Data:   Vitals:   09/03/21 1119 09/03/21 1123 09/03/21 1230  BP:  (!) 171/114 (!) 169/105  Pulse:  76 80  Resp:  16 19  Temp: 97.9 F (36.6 C)    TempSrc: Oral    SpO2:  97% 97%  Weight: 106.6 kg    Height: 6' (1.829 m)     No intake or output data in the 24 hours  ending 09/03/21 1255 Last 3 Weights 09/03/2021 04/01/2016 04/01/2016  Weight (lbs) 235 lb 227 lb 225 lb  Weight (kg) 106.595 kg 102.967 kg 102.059 kg     Body mass index is 31.87 kg/m.  General:  OBese 61 yo in no acute distress HEENT: normal Neck: Neck is full  No bruits  no  JVD Vascular: No carotid bruits; Distal pulses 2+ bilaterally   Cardiac:  normal S1, S2; RRR; no murmur No rub Chest   Nontender  Lungs:  clear to auscultation bilaterally, no wheezing, rhonchi or rales  Abd: soft, nontender, no hepatomegaly   no masses no pulsations  Ext: no LE  edema Musculoskeletal:  No deformities, BUE and BLE strength normal and equal Skin: warm and dry  Neuro:  CNs 2-12 intact, no focal abnormalities noted Psych:  Normal affect    EKG:  The ECG that was done today  was personally reviewed and demonstrates   NSR 79 bpm   Q wave in III   nonspecific ST changes   Relevant CV Studies: Echo being done   Laboratory Data:  High Sensitivity Troponin:   Recent Labs  Lab 09/03/21 1144  TROPONINIHS 12,799*      Chemistry Recent Labs  Lab 09/03/21 1144  NA 137  K 3.6  CL 103  CO2 26  GLUCOSE 108*  BUN 19  CREATININE 1.24  CALCIUM 9.7  GFRNONAA >60  ANIONGAP 8    No results for input(s): PROT, ALBUMIN, AST, ALT, ALKPHOS, BILITOT in the last 168 hours. Lipids No results for input(s): CHOL, TRIG, HDL, LABVLDL, LDLCALC, CHOLHDL in the last 168 hours. Hematology Recent Labs  Lab 09/03/21 1144  WBC 7.1  RBC 5.40  HGB 15.5  HCT 45.3  MCV 83.9  MCH 28.7  MCHC 34.2  RDW 12.3  PLT 201   Thyroid No results for input(s): TSH, FREET4 in the last 168 hours. BNP Recent Labs  Lab 09/03/21 1144  BNP 65.0    DDimer No results for input(s): DDIMER in the last 168 hours.   Radiology/Studies:  DG Chest Portable 1 View  Result Date: 09/03/2021 CLINICAL DATA:  Chest pain. EXAM: PORTABLE CHEST 1 VIEW COMPARISON:  Chest x-ray dated March 21, 2016. FINDINGS: The heart size and  mediastinal contours are within normal limits. Both lungs are clear. The  visualized skeletal structures are unremarkable. IMPRESSION: No active disease. Electronically Signed   By: Titus Dubin M.D.   On: 09/03/2021 11:42     Assessment and Plan:   CP/ NSTEMI  Pt with hx of progressive DOE   2 days of chest pain   Last night severe CP   Now gone   EKG unremarkable   Trop significantly elevated at 12,000     On xam  No CHF    No rubs   Echo images:  With Definity use the basal inferior wall appears akinetic , basal inferolateral wall is hypokinetic.  I have reviewed with pt   Would recomm a L heart cath to define anatomy   Risks/benefits described   Pt understands an agrees to proceed   Will plan for tx to [facility] for procedure today     2  HTN   High today  Follow  Needs better control   Will need to adjust meds after cath  3  HL   Lipids drawn  4  Obesity   need to review diet with pt     For questions or updates, please contact [facility] Please consult [email or web address] for contact info under     Signed, Dorris Carnes, MD  09/03/2021 12:55 PM

## 2021-09-03 NOTE — H&P (Signed)
History and Physical   Patient: Jason [name]                            PCP: Estanislado PandySasser, Paul W, MD                    DOB: 27-Mar-1961            DOA: 09/03/2021 [id number]             DOS: 09/03/2021, 1:29 PM  Sasser, Clarene CritchleyPaul W, MD  Patient coming from:   HOME  I have personally reviewed patient's medical records, in electronic medical records, including:  [facility], and care everywhere.    Chief Complaint:   Chief Complaint  Patient presents with   Chest Pain    History of present illness:   Jason [name] is a 61 year old male with a history of hypertension hyperlipidemia, arthritis, migraine presented today with chief complaint of chest pain.  He stated the pain started last night and is progressively getting worse.  Is described the pain as intermittent burning in nature worse at night sitting improves the pain somewhat. Reported pain is now stable radiates to his arm and elbow and associate with some shortness of breath.  Patient Denies having: Fever, Chills, Cough, SOB, Chest Pain, Abd pain, N/V/D, headache, dizziness, lightheadedness,  Dysuria, Joint pain, rash, open wounds  ED Course:   Vitals:   09/03/21 1123 09/03/21 1230  BP: (!) 171/114 (!) 169/105  Pulse: 76 80  Resp: 16 19  Temp:    SpO2: 97% 97%   Abnormal labs; reviewed Troponin 12 799,>>> 12,903    Review of Systems: As per HPI, otherwise 10 point review of systems were negative.   ----------------------------------------------------------------------------------------------------------------------  Allergies  Allergen Reactions   Lisinopril    No Known Allergies     Home MEDs:  Prior to Admission medications   Medication Sig Start Date End Date Taking? Authorizing Provider  olmesartan-hydrochlorothiazide (BENICAR HCT) 40-25 MG tablet Take 1 tablet by mouth daily. 07/19/21  Yes [provider]  Omega-3 Fatty Acids (FISH OIL) 1200 MG CAPS Take 1,200 mg by mouth daily.   Yes  [provider]  apixaban (ELIQUIS) 2.5 MG TABS tablet Take 1 tablet (2.5 mg total) by mouth 2 (two) times daily. Patient not taking: Reported on 09/03/2021 04/01/16   Chadwell, Ivin BootyJoshua, PA-C  oxyCODONE-acetaminophen (ROXICET) 5-325 MG tablet Take 1-2 tablets by mouth every 4 (four) hours as needed for moderate pain or severe pain. Patient not taking: Reported on 09/03/2021 04/01/16   Chadwell, Ivin BootyJoshua, PA-C  polyethylene glycol (MIRALAX / GLYCOLAX) packet Take 17 g by mouth daily as needed for mild constipation. Patient not taking: Reported on 09/03/2021 04/02/16   Montez MoritaPaul, Keith, PA-C    PRN MEDs: acetaminophen, ondansetron (ZOFRAN) IV  Past Medical History:  Diagnosis Date   Arthritis    Headache    migraines    last one "been awhile" 6 mths or so   HTN (hypertension)    Hyperlipidemia     Past Surgical History:  Procedure Laterality Date   KNEE ARTHROSCOPY Left    3 on left and 1 on the right   TONSILLECTOMY     TOTAL KNEE ARTHROPLASTY Left 04/01/2016   Procedure: TOTAL KNEE ARTHROPLASTY;  Surgeon: Frederico Hammananiel Caffrey, MD;  Location: [facility] OR;  Service: Orthopedics;  Laterality: Left;     reports that he has never smoked. He has never used  smokeless tobacco. He reports current alcohol use of about 10.0 standard drinks per week. He reports that he does not use drugs.   History reviewed. No pertinent family history.  Physical Exam:   Vitals:   09/03/21 1119 09/03/21 1123 09/03/21 1230  BP:  (!) 171/114 (!) 169/105  Pulse:  76 80  Resp:  16 19  Temp: 97.9 F (36.6 C)    TempSrc: Oral    SpO2:  97% 97%  Weight: 106.6 kg    Height: 6' (1.829 m)     Constitutional: Complain of chest pain improved with pain medication Eyes: PERRL, lids and conjunctivae normal ENMT: Mucous membranes are moist. Posterior pharynx clear of any exudate or lesions.Normal dentition.  Neck: normal, supple, no masses, no thyromegaly Respiratory: clear to auscultation bilaterally, no wheezing, no crackles.  Normal respiratory effort. No accessory muscle use.  Cardiovascular: Regular rate and rhythm, no murmurs / rubs / gallops. No extremity edema. 2+ pedal pulses. No carotid bruits.  Abdomen: no tenderness, no masses palpated. No hepatosplenomegaly. Bowel sounds positive.  Musculoskeletal: no clubbing / cyanosis. No joint deformity upper and lower extremities. Good ROM, no contractures. Normal muscle tone.  Neurologic: CN II-XII grossly intact. Sensation intact, DTR normal. Strength 5/5 in all 4.  Psychiatric: Normal judgment and insight. Alert and oriented x 3. Normal mood.  Skin: no rashes, lesions, ulcers. No induration Decubitus/ulcers:  Wounds: per nursing documentation         Labs on admission:    I have personally reviewed following labs and imaging studies  CBC: Recent Labs  Lab 09/03/21 1144  WBC 7.1  HGB 15.5  HCT 45.3  MCV 83.9  PLT 201   Basic Metabolic Panel: Recent Labs  Lab 09/03/21 1144  NA 137  K 3.6  CL 103  CO2 26  GLUCOSE 108*  BUN 19  CREATININE 1.24  CALCIUM 9.7   Urine analysis:    Component Value Date/Time   COLORURINE YELLOW 03/21/2016 1203   APPEARANCEUR CLEAR 03/21/2016 1203   LABSPEC 1.009 03/21/2016 1203   PHURINE 6.0 03/21/2016 1203   GLUCOSEU NEGATIVE 03/21/2016 1203   HGBUR NEGATIVE 03/21/2016 1203   BILIRUBINUR NEGATIVE 03/21/2016 1203   KETONESUR NEGATIVE 03/21/2016 1203   PROTEINUR NEGATIVE 03/21/2016 1203   NITRITE NEGATIVE 03/21/2016 1203   LEUKOCYTESUR NEGATIVE 03/21/2016 1203    Last A1C:  No results found for: HGBA1C   Radiologic Exams on Admission:   DG Chest Portable 1 View  Result Date: 09/03/2021 CLINICAL DATA:  Chest pain. EXAM: PORTABLE CHEST 1 VIEW COMPARISON:  Chest x-ray dated March 21, 2016. FINDINGS: The heart size and mediastinal contours are within normal limits. Both lungs are clear. The visualized skeletal structures are unremarkable. IMPRESSION: No active disease. Electronically Signed   By:  Obie Dredge M.D.   On: 09/03/2021 11:42    EKG:   Independently reviewed.  Orders placed or performed during the hospital encounter of 09/03/21   EKG 12-Lead   EKG 12-Lead   ED EKG   ED EKG   EKG 12-Lead (at 6am)   ---------------------------------------------------------------------------------------------------------------------------------------    Assessment / Plan:   Principal Problem:   NSTEMI (non-ST elevated myocardial infarction) (HCC) Active Problems:   HTN (hypertension)   Chest pain   Hyperlipidemia   Assessment and Plan: * NSTEMI (non-ST elevated myocardial infarction) (HCC)- (present on admission) - Patient be admitted to cardiac unit at [facility] -Patient will be kept n.p.o. -Initiated IV heparin drip, nitroglycerin and aspirin and statins -Anticipating  urgent cardiac cath -Cardiology Dr. Tenny Craw has been notified Patient will be transferred to cardiology service -  Chest pain- (present on admission) - Likely due to non-STEMI -As needed morphine has been ordered  HTN (hypertension)- (present on admission) - Currently stable, monitoring closely -Currently n.p.o., resuming home medication when stable -As needed hydralazine  Hyperlipidemia- (present on admission) - Resuming high-dose statins -Following a fast lipid panel   -------------------------------------------------------------------------------------------------------------------------------------------- DVT prophylaxis:  SCDs Start: 09/03/21 1306   Code Status:   Code Status: Full Code   Admission status: Patient will be admitted as Inpatient, with a greater than 2 midnight length of stay. Level of care: Telemetry Cardiac   Family Communication:  none at bedside  (The above findings and plan of care has been discussed with patient in detail, the patient expressed understanding and agreement of above plan)   --------------------------------------------------------------------------------------------------------------------------------------------------  Disposition Plan: >3 days Status is: Inpatient Remains inpatient appropriate because: Evaluation for non-STEMI, needing intervention possible cardiac catheterization... Stabilization from [place] standpoint ----------------------------------------------------------------------------------------------------------------------------------------------------  Time spent: > than  55  Min.   SIGNED: Kendell Bane, MD, FHM. [facility],  Pager (Please use [email or web address] to page to text)  If 7PM-7AM, please contact night-coverage [email or web address],  09/03/2021, 1:29 PM

## 2021-09-03 NOTE — Assessment & Plan Note (Signed)
-   Currently stable, monitoring closely -Currently n.p.o., resuming home medication when stable -As needed hydralazine

## 2021-09-03 NOTE — ED Provider Notes (Signed)
[facility] Provider Note   CSN: [id number] Arrival date & time: 09/03/21  1108     History  Chief Complaint  Patient presents with   Chest Pain    Jason [name] is a 61 y.o. male.   Chest Pain  Patient with history hypertension, hyperlipidemia presents due to chest pain.  Started 2 days ago, feels like a burning pain.  Its been intermittent, worsened last night.  States sitting upright helped alleviate it, feels giving sensation it radiates down his arms bilaterally and ends at the elbow.  Denies any exertional component.  Does endorse feeling short of breath with exertion although this is new.  Denies any history of orthopnea, has not noticed any lower extremity swelling.  Denies any shortness of breath, nausea, or vomiting.  No history of MI or PE.  Patient denies any smoking history.  Home Medications Prior to Admission medications   Medication Sig Start Date End Date Taking? Authorizing Provider  olmesartan-hydrochlorothiazide (BENICAR HCT) 40-25 MG tablet Take 1 tablet by mouth daily. 07/19/21  Yes [provider]  Omega-3 Fatty Acids (FISH OIL) 1200 MG CAPS Take 1,200 mg by mouth daily.   Yes [provider]  apixaban (ELIQUIS) 2.5 MG TABS tablet Take 1 tablet (2.5 mg total) by mouth 2 (two) times daily. Patient not taking: Reported on 09/03/2021 04/01/16   Chadwell, Vonna Kotyk, PA-C  oxyCODONE-acetaminophen (ROXICET) 5-325 MG tablet Take 1-2 tablets by mouth every 4 (four) hours as needed for moderate pain or severe pain. Patient not taking: Reported on 09/03/2021 04/01/16   Chadwell, Vonna Kotyk, PA-C  polyethylene glycol (MIRALAX / GLYCOLAX) packet Take 17 g by mouth daily as needed for mild constipation. Patient not taking: Reported on 09/03/2021 04/02/16   Ainsley Spinner, PA-C      Allergies    Lisinopril and No known allergies    Review of Systems   Review of Systems  Cardiovascular:  Positive for chest pain.   Physical Exam Updated Vital  Signs BP (!) 169/105    Pulse 80    Temp 97.9 F (36.6 C) (Oral)    Resp 19    Ht 6' (1.829 m)    Wt 106.6 kg    SpO2 97%    BMI 31.87 kg/m  Physical Exam Vitals and nursing note reviewed. Exam conducted with a chaperone present.  Constitutional:      Appearance: Normal appearance.  HENT:     Head: Normocephalic and atraumatic.  Eyes:     General: No scleral icterus.       Right eye: No discharge.        Left eye: No discharge.     Extraocular Movements: Extraocular movements intact.     Pupils: Pupils are equal, round, and reactive to light.  Cardiovascular:     Rate and Rhythm: Normal rate and regular rhythm.     Pulses: Normal pulses.     Heart sounds: Normal heart sounds. No murmur heard.   No friction rub. No gallop.  Pulmonary:     Effort: Pulmonary effort is normal. No respiratory distress.     Breath sounds: Normal breath sounds.  Abdominal:     General: Abdomen is flat. Bowel sounds are normal. There is no distension.     Palpations: Abdomen is soft.     Tenderness: There is no abdominal tenderness.  Skin:    General: Skin is warm and dry.     Coloration: Skin is not jaundiced.  Neurological:  Mental Status: He is alert. Mental status is at baseline.     Coordination: Coordination normal.    ED Results / Procedures / Treatments   Labs (all labs ordered are listed, but only abnormal results are displayed) Labs Reviewed  BASIC METABOLIC PANEL - Abnormal; Notable for the following components:      Result Value   Glucose, Bld 108 (*)    All other components within normal limits  HEPARIN LEVEL (UNFRACTIONATED) - Abnormal; Notable for the following components:   Heparin Unfractionated <0.10 (*)    All other components within normal limits  TROPONIN I (HIGH SENSITIVITY) - Abnormal; Notable for the following components:   Troponin I (High Sensitivity) 12,799 (*)    All other components within normal limits  RESP PANEL BY RT-PCR (FLU A&B, COVID) ARPGX2  CBC   BRAIN NATRIURETIC PEPTIDE  APTT  HIV ANTIBODY (ROUTINE TESTING W REFLEX)  LIPID PANEL  HEPARIN LEVEL (UNFRACTIONATED)  TROPONIN I (HIGH SENSITIVITY)    EKG EKG Interpretation  Date/Time:  Friday September 03 2021 11:19:29 EST Ventricular Rate:  79 PR Interval:  150 QRS Duration: 94 QT Interval:  388 QTC Calculation: 444 R Axis:   -9 Text Interpretation: Normal sinus rhythm Inferior infarct , age undetermined Abnormal ECG When compared with ECG of 21-Mar-2016 12:01, Inferior infarct is now Present Confirmed by Fredia Sorrow [id number]) on 09/03/2021 11:30:43 AM  Radiology DG Chest Portable 1 View  Result Date: 09/03/2021 CLINICAL DATA:  Chest pain. EXAM: PORTABLE CHEST 1 VIEW COMPARISON:  Chest x-ray dated March 21, 2016. FINDINGS: The heart size and mediastinal contours are within normal limits. Both lungs are clear. The visualized skeletal structures are unremarkable. IMPRESSION: No active disease. Electronically Signed   By: Titus Dubin M.D.   On: 09/03/2021 11:42    Procedures .Critical Care Performed by: Sherrill Raring, PA-C Authorized by: Sherrill Raring, PA-C   Critical care provider statement:    Critical care time (minutes):  30   Critical care start time:  09/03/2021 12:30 PM   Critical care end time:  09/03/2021 1:00 PM   Critical care time was exclusive of:  Separately billable procedures and treating other patients   Critical care was necessary to treat or prevent imminent or life-threatening deterioration of the following conditions:  Cardiac failure   Critical care was time spent personally by me on the following activities:  Development of treatment plan with patient or surrogate, discussions with consultants, evaluation of patient's response to treatment, examination of patient, ordering and review of laboratory studies, ordering and review of radiographic studies, ordering and performing treatments and interventions, pulse oximetry, re-evaluation of patient's condition and  review of old charts   Care discussed with: admitting provider      Medications Ordered in ED Medications  aspirin tablet 325 mg (325 mg Oral Given 09/03/21 1240)  nitroGLYCERIN 50 mg in dextrose 5 % 250 mL (0.2 mg/mL) infusion (10 mcg/min Intravenous Rate/Dose Change 09/03/21 1314)  0.9 %  sodium chloride infusion (has no administration in time range)  alum & mag hydroxide-simeth (MAALOX/MYLANTA) 200-200-20 MG/5ML suspension 30 mL (has no administration in time range)    And  lidocaine (XYLOCAINE) 2 % viscous mouth solution 15 mL (has no administration in time range)  acetaminophen (TYLENOL) tablet 650 mg (has no administration in time range)  ondansetron (ZOFRAN) injection 4 mg (has no administration in time range)  heparin bolus via infusion 4,000 Units (4,000 Units Intravenous Bolus from Bag 09/03/21 1323)    Followed  by  heparin ADULT infusion 100 units/mL (25000 units/248mL) (1,200 Units/hr Intravenous New Bag/Given 09/03/21 1322)  morphine (PF) 4 MG/ML injection 4 mg (4 mg Intravenous Given 09/03/21 1240)  ondansetron (ZOFRAN) injection 4 mg (4 mg Intravenous Given 09/03/21 1239)    ED Course/ Medical Decision Making/ A&P                           Medical Decision Making Amount and/or Complexity of Data Reviewed Labs: ordered. Radiology: ordered.  Risk OTC drugs. Prescription drug management. Decision regarding hospitalization.   This patient presents to the ED for concern of chest pain, this involves an extensive number of treatment options, and is a complaint that carries with it a high risk of complications and morbidity.  The differential diagnosis includes ACS, dissection, pneumonia, PE, other   Co morbidities that complicate the patient evaluation: N/A   Additional history obtained: -Additional history obtained from wife (at bedside) -External records from outside source obtained and reviewed including: Chart review including previous notes, labs, imaging, consultation  notes   Lab Tests: -I ordered, reviewed, and interpreted labs.  The pertinent results include: No leukocytosis or anemia, within normal limits BNP, no gross electrolyte derangement.  Patient has a troponin of 12,799   EKG -No ST elevation, Q waves in the inferior leads.   Imaging Studies ordered: -I ordered imaging studies including chest x-ray -I independently visualized and interpreted imaging which showed no cardiomegaly -I agree with the radiologist interpretation   Medicines ordered and prescription drug management: -I ordered medication including heparin, nitro, morphine, aspirin for ACS -Reevaluation of the patient after these medicines showed that the patient stayed the same -I have reviewed the patients home medicines and have made adjustments as needed   Consultations Obtained: I requested consultation with the on-call cardiologist and hospitalist,  and discussed lab and imaging findings as well as pertinent plan - they recommend:   Cardiology: Spoke with  Dr. Dorris Carnes with cardiology service, she states start the patient on heparin and nitro drip.  Advises patient to be transferred to [facility].   ED Course: Patient presents with chest pain.  He has risk factors of hypertension hyperlipidemia, he is hypertensive on intake with vitals of 171/114.  Initial troponin was notable for 12,799.  Spoke with cardiology who advises starting the patient on heparin and nitro drip.  Given there is no cardiology coverage over the weekend we will proceed to admit patient and plan for transfer to [facility] for NSTEMI.   Critical Interventions: Initiation of anticoagulation   Cardiac Monitoring: The patient was maintained on a cardiac monitor.  I personally viewed and interpreted the cardiac monitored which showed an underlying rhythm of: Sinus rhythm with inferior Q waves.  No ST elevation noted.   Reevaluation: After the interventions noted above, I reevaluated the patient  and found that they have :stayed the same   Dispostion: Admit           Final Clinical Impression(s) / ED Diagnoses Final diagnoses:  NSTEMI (non-ST elevated myocardial infarction) [facility])    Rx / [place] Orders ED Discharge Orders     None         Sherrill Raring, PA-C 09/03/21 1329    Fredia Sorrow, MD 09/03/21 1337

## 2021-09-03 NOTE — Assessment & Plan Note (Signed)
-   Likely due to non-STEMI -As needed morphine has been ordered

## 2021-09-03 NOTE — Hospital Course (Addendum)
°  Mr. Jason [name] is a 61 year old male with a history of hypertension hyperlipidemia, arthritis, migraine presented today with chief complaint of chest pain.  He stated the pain started approximate 2 days ago and is progressively getting worse.  Is described the pain as intermittent burning in nature worse at night sitting improves the pain somewhat. Reported pain is now stable radiates to his arm and elbow and associate with some shortness of breath.

## 2021-09-03 NOTE — Plan of Care (Signed)
  Problem: Nutrition: Goal: Adequate nutrition will be maintained Outcome: Progressing   

## 2021-09-03 NOTE — Assessment & Plan Note (Signed)
-   Patient be admitted to cardiac unit at [facility] -Patient will be kept n.p.o. -Initiated IV heparin drip, nitroglycerin and aspirin and statins -Anticipating urgent cardiac cath -Cardiology Dr. Tenny Craw has been notified Patient will be transferred to cardiology service -

## 2021-09-03 NOTE — ED Triage Notes (Signed)
Pt with mid CP , intense per pt last night.  Had CP earlier but resolved on own. Denies any N/V, SOb with exertion per pt.  Pt states it radiates down both arms.

## 2021-09-03 NOTE — ED Notes (Signed)
Pt ambulated to and from bathroom without complications. Nurse notified ?

## 2021-09-03 NOTE — ED Notes (Signed)
ECHO being performed at this time ?

## 2021-09-03 NOTE — ED Notes (Signed)
Cardiology at bedside.

## 2021-09-03 NOTE — ED Notes (Signed)
Trop results reported to [place], [place]

## 2021-09-03 NOTE — Assessment & Plan Note (Signed)
-   Resuming high-dose statins -Following a fast lipid panel

## 2021-09-04 DIAGNOSIS — N179 Acute kidney failure, unspecified: Secondary | ICD-10-CM

## 2021-09-04 LAB — CBC
HCT: 39.7 % (ref 39.0–52.0)
Hemoglobin: 13.9 g/dL (ref 13.0–17.0)
MCH: 29.5 pg (ref 26.0–34.0)
MCHC: 35 g/dL (ref 30.0–36.0)
MCV: 84.3 fL (ref 80.0–100.0)
Platelets: 195 10*3/uL (ref 150–400)
RBC: 4.71 MIL/uL (ref 4.22–5.81)
RDW: 12.3 % (ref 11.5–15.5)
WBC: 9.2 10*3/uL (ref 4.0–10.5)
nRBC: 0 % (ref 0.0–0.2)

## 2021-09-04 LAB — BASIC METABOLIC PANEL
Anion gap: 10 (ref 5–15)
Anion gap: 7 (ref 5–15)
BUN: 19 mg/dL (ref 6–20)
BUN: 21 mg/dL — ABNORMAL HIGH (ref 6–20)
CO2: 23 mmol/L (ref 22–32)
CO2: 24 mmol/L (ref 22–32)
Calcium: 8.9 mg/dL (ref 8.9–10.3)
Calcium: 8.8 mg/dL — ABNORMAL LOW (ref 8.9–10.3)
Chloride: 102 mmol/L (ref 98–111)
Chloride: 106 mmol/L (ref 98–111)
Creatinine, Ser: 1.67 mg/dL — ABNORMAL HIGH (ref 0.61–1.24)
Creatinine, Ser: 1.57 mg/dL — ABNORMAL HIGH (ref 0.61–1.24)
GFR, Estimated: 47 mL/min — ABNORMAL LOW (ref >60)
GFR, Estimated: 51 mL/min — ABNORMAL LOW (ref >60)
Glucose, Bld: 140 mg/dL — ABNORMAL HIGH (ref 70–99)
Glucose, Bld: 124 mg/dL — ABNORMAL HIGH (ref 70–99)
Potassium: 3.9 mmol/L (ref 3.5–5.1)
Potassium: 3.6 mmol/L (ref 3.5–5.1)
Sodium: 135 mmol/L (ref 135–145)
Sodium: 137 mmol/L (ref 135–145)

## 2021-09-04 MED ORDER — SODIUM CHLORIDE 0.9 % IV BOLUS
250.0000 mL | Freq: Once | INTRAVENOUS | Status: AC
  Administered 2021-09-04: 250 mL via INTRAVENOUS

## 2021-09-04 MED ORDER — HEART ATTACK BOUNCING BOOK
Freq: Once | Status: DC
  Filled 2021-09-04: qty 1

## 2021-09-04 MED ORDER — ANGIOPLASTY BOOK
Freq: Once | Status: DC
  Filled 2021-09-04: qty 1

## 2021-09-04 MED ORDER — TICAGRELOR 90 MG PO TABS: 90.0000 mg | ORAL_TABLET | Freq: Two times a day (BID) | ORAL | 3 refills | Status: AC

## 2021-09-04 MED ORDER — NITROGLYCERIN 0.4 MG SL SUBL: 0.4000 mg | SUBLINGUAL_TABLET | SUBLINGUAL | 2 refills | Status: DC | PRN

## 2021-09-04 MED ORDER — ATORVASTATIN CALCIUM 80 MG PO TABS: 80.0000 mg | ORAL_TABLET | Freq: Every day | ORAL | 3 refills | Status: DC

## 2021-09-04 MED ORDER — THE SENSUOUS HEART BOOK
Freq: Once | Status: DC
  Filled 2021-09-04: qty 1

## 2021-09-04 MED ORDER — ASPIRIN 81 MG PO TBEC: 81.0000 mg | DELAYED_RELEASE_TABLET | Freq: Every day | ORAL | 11 refills | Status: DC

## 2021-09-04 MED ORDER — METOPROLOL TARTRATE 25 MG PO TABS: 25.0000 mg | ORAL_TABLET | Freq: Two times a day (BID) | ORAL | 3 refills | Status: DC

## 2021-09-04 NOTE — Plan of Care (Signed)

## 2021-09-04 NOTE — Progress Notes (Signed)
Received referral to assist pt with meds and a Brilinta card. Met with pt. He denies any issues filling his BP prescription. He reports that he only takes 1 BP med. Provided pt with a 30 free day and $5 co-pay Brilinta card.

## 2021-09-04 NOTE — Progress Notes (Signed)
TR BAND REMOVAL  LOCATION:    right radial  DEFLATED PER PROTOCOL:    Yes.    TIME BAND OFF / DRESSING APPLIED:    2230   SITE UPON ARRIVAL:    Level 0  SITE AFTER BAND REMOVAL:    Level 0  CIRCULATION SENSATION AND MOVEMENT:    Within Normal Limits   Yes.    COMMENTS:   Pt.tolerated the procedure well

## 2021-09-04 NOTE — Progress Notes (Addendum)
Progress Note  Patient Name: Jason [name] Date of Encounter: 09/04/2021  [facility] Cardiologist: Dietrich PatesPaula Ross, MD   Subjective   No chest pain. Has not been up walking yet.   Inpatient Medications    Scheduled Meds:  angioplasty book   Does not apply Once   aspirin EC  81 mg Oral Daily   atorvastatin  80 mg Oral Daily   heart attack bouncing book   Does not apply Once   metoprolol tartrate  25 mg Oral BID   sodium chloride flush  3 mL Intravenous Q12H   the sensuous heart book   Does not apply Once   ticagrelor  90 mg Oral BID   Continuous Infusions:  sodium chloride 125 mL/hr at 09/03/21 1827   sodium chloride     nitroGLYCERIN Stopped (09/03/21 1613)   sodium chloride     PRN Meds: sodium chloride, acetaminophen, nitroGLYCERIN, ondansetron (ZOFRAN) IV, sodium chloride flush   Vital Signs    Vitals:   09/04/21 0240 09/04/21 0340 09/04/21 0509 09/04/21 0655  BP: 104/69 99/72 116/79 98/66  Pulse:   78   Resp: 11 17 12 14   Temp:   98.5 F (36.9 C)   TempSrc:   Oral   SpO2: 96% 97% 97% 98%  Weight:      Height:        Intake/Output Summary (Last 24 hours) at 09/04/2021 0821 Last data filed at 09/04/2021 0500 Gross per 24 hour  Intake 721.74 ml  Output 600 ml  Net 121.74 ml   Last 3 Weights 09/03/2021 04/01/2016 04/01/2016  Weight (lbs) 235 lb 227 lb 225 lb  Weight (kg) 106.595 kg 102.967 kg 102.059 kg      Telemetry    Sinus rhythm with HR 80s - Personally Reviewed  ECG    NSR HR 69, iRBBB patter with normal QRS, inferior infarct - Personally Reviewed  Physical Exam   GEN: No acute distress.   Neck: No JVD Cardiac: RRR, no murmurs, rubs, or gallops.  Respiratory: Clear to auscultation bilaterally. GI: Soft, nontender, non-distended  MS: No edema; No deformity. Neuro:  Nonfocal  Psych: Normal affect  Right radial cath site C/D/I  Labs    High Sensitivity Troponin:   Recent Labs  Lab 09/03/21 1144 09/03/21 1310  TROPONINIHS 12,799*  12,903*     Chemistry Recent Labs  Lab 09/03/21 1144 09/04/21 0219  NA 137 135  K 3.6 3.9  CL 103 102  CO2 26 23  GLUCOSE 108* 140*  BUN 19 19  CREATININE 1.24 1.67*  CALCIUM 9.7 8.9  GFRNONAA >60 47*  ANIONGAP 8 10    Lipids  Recent Labs  Lab 09/03/21 1340  CHOL 225*  TRIG 177*  HDL 29*  LDLCALC 161*  CHOLHDL 7.8    Hematology Recent Labs  Lab 09/03/21 1144 09/04/21 0219  WBC 7.1 9.2  RBC 5.40 4.71  HGB 15.5 13.9  HCT 45.3 39.7  MCV 83.9 84.3  MCH 28.7 29.5  MCHC 34.2 35.0  RDW 12.3 12.3  PLT 201 195   Thyroid No results for input(s): TSH, FREET4 in the last 168 hours.  BNP Recent Labs  Lab 09/03/21 1144  BNP 65.0    DDimer No results for input(s): DDIMER in the last 168 hours.   Radiology    CARDIAC CATHETERIZATION  Result Date: 09/03/2021   Mid RCA lesion is 100% stenosed.   Prox Cx lesion is 30% stenosed.   1st Mrg lesion is 30%  stenosed.   Mid LAD lesion is 40% stenosed.   Prox RCA lesion is 60% stenosed.   A drug-eluting stent was successfully placed using a SYNERGY XD 4.0X28.   Post intervention, there is a 0% residual stenosis. 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 Will start a beta blocker. Continue high intensity statin. Fast track discharge tomorrow if stable. Resume home ARB tomorrow.   DG Chest Portable 1 View  Result Date: 09/03/2021 CLINICAL DATA:  Chest pain. EXAM: PORTABLE CHEST 1 VIEW COMPARISON:  Chest x-ray dated March 21, 2016. FINDINGS: The heart size and mediastinal contours are within normal limits. Both lungs are clear. The visualized skeletal structures are unremarkable. IMPRESSION: No active disease. Electronically Signed   By: Obie Dredge M.D.   On: 09/03/2021 11:42   ECHOCARDIOGRAM COMPLETE  Result Date: 09/03/2021    ECHOCARDIOGRAM REPORT   Patient Name:    Jason [name] Date of Exam: 09/03/2021 Medical Rec #:  [id number]        Height:       72.0 in Accession #:    [id number]       Weight:       235.0 lb Date of Birth:  06-01-1961        BSA:          2.282 m Patient Age:    60 years         BP:           138/98 mmHg Patient Gender: M                HR:           81 bpm. [facility] Procedure: 2D Echo, Cardiac Doppler and Color Doppler Indications:    Elevated Troponin  History:        Patient has no prior history of Echocardiogram examinations.                 Previous Myocardial Infarction; Risk Factors:Hypertension and                 Dyslipidemia.  Sonographer:    Celesta Gentile RCS Referring Phys: [id number] PAULA V ROSS IMPRESSIONS  1. Akinesis of the basal inferior wall, hypokinesis of the basal inferolataral wall. . Left ventricular ejection fraction, by estimation, is 55 to 60%. The left ventricle has normal function. The left ventricle demonstrates regional wall motion abnormalities (see scoring diagram/findings for description). Left ventricular diastolic parameters were normal.  2. Right ventricular systolic function is normal. The right ventricular size is normal.  3. The mitral valve is normal in structure. Trivial mitral valve regurgitation.  4. The aortic valve is normal in structure. Aortic valve regurgitation is not visualized. FINDINGS  Left Ventricle: Akinesis of the basal inferior wall, hypokinesis of the basal inferolataral wall. Left ventricular ejection fraction, by estimation, is 55 to 60%. The left ventricle has normal function. The left ventricle demonstrates regional wall motion abnormalities. Definity contrast agent was given IV to delineate the left ventricular endocardial borders. The left ventricular internal cavity size was normal in size. There is no left ventricular hypertrophy. Left ventricular diastolic parameters were normal. Right Ventricle: The right ventricular size is normal. Right vetricular wall thickness was not  assessed. Right ventricular systolic function is normal. Left Atrium: Left atrial size was normal in size. Right Atrium:  Right atrial size was normal in size. Pericardium: There is no evidence of pericardial effusion. Mitral Valve: The mitral valve is normal in structure. Trivial mitral valve regurgitation. Tricuspid Valve: The tricuspid valve is normal in structure. Tricuspid valve regurgitation is trivial. Aortic Valve: The aortic valve is normal in structure. Aortic valve regurgitation is not visualized. Pulmonic Valve: The pulmonic valve was normal in structure. Pulmonic valve regurgitation is trivial. Aorta: The aortic root was not well visualized. IAS/Shunts: No atrial level shunt detected by color flow Doppler.  LEFT VENTRICLE PLAX 2D LVIDd:         4.60 cm   Diastology LVIDs:         3.30 cm   LV e' medial:    5.22 cm/s LV PW:         1.00 cm   LV E/e' medial:  13.2 LV IVS:        1.00 cm   LV e' lateral:   9.14 cm/s LVOT diam:     2.00 cm   LV E/e' lateral: 7.5 LV SV:         69 LV SV Index:   30 LVOT Area:     3.14 cm  RIGHT VENTRICLE RV S prime:     16.40 cm/s TAPSE (M-mode): 2.3 cm LEFT ATRIUM             Index        RIGHT ATRIUM           Index LA diam:        3.30 cm 1.45 cm/m   RA Area:     12.90 cm LA Vol (A2C):   48.0 ml 21.04 ml/m  RA Volume:   33.10 ml  14.51 ml/m LA Vol (A4C):   55.7 ml 24.41 ml/m LA Biplane Vol: 55.9 ml 24.50 ml/m  AORTIC VALVE LVOT Vmax:   117.00 cm/s LVOT Vmean:  68.800 cm/s LVOT VTI:    0.220 m  AORTA Ao Root diam: 3.10 cm MITRAL VALVE MV Area (PHT): 2.99 cm    SHUNTS MV Decel Time: 254 msec    Systemic VTI:  0.22 m MV E velocity: 69.00 cm/s  Systemic Diam: 2.00 cm MV A velocity: 90.80 cm/s MV E/A ratio:  0.76 Dietrich Pates MD Electronically signed by Dietrich Pates MD Signature Date/Time: 09/03/2021/3:39:48 PM    Final     Cardiac Studies   Left heart cath 09/03/21:   Mid RCA lesion is 100% stenosed.   Prox Cx lesion is 30% stenosed.   1st Mrg lesion is 30% stenosed.    Mid LAD lesion is 40% stenosed.   Prox RCA lesion is 60% stenosed.   A drug-eluting stent was successfully placed using a SYNERGY XD 4.0X28.   Post intervention, there is a 0% residual stenosis.   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 Will start a beta blocker. Continue high intensity statin. Fast track discharge tomorrow if stable. Resume home ARB tomorrow.    Echo 09/03/21:  1. Akinesis of the basal inferior wall, hypokinesis of the basal  inferolataral wall. . Left ventricular ejection fraction, by estimation,  is 55 to 60%. The left ventricle has normal function. The left ventricle  demonstrates regional wall motion  abnormalities (see scoring diagram/findings for description). Left  ventricular diastolic parameters were normal.   2. Right  ventricular systolic function is normal. The right ventricular  size is normal.   3. The mitral valve is normal in structure. Trivial mitral valve  regurgitation.   4. The aortic valve is normal in structure. Aortic valve regurgitation is  not visualized.   Patient Profile     61 y.o. male with hx of HTN [facility].   Assessment & Plan     CAD Heart cath yesterday revealed 100% stenosis in the mid RCA successfully treated with DES. Otherwise, nonobstructive disease in the Cx, 1st marginal, and LAD. He was started on ASA and brilinta x 12 months. Metoprolol 25 mg BID was started.   Echo with normal LVEF 55-60%, RWMA, normal diastolic  function and normal RV fx, no significant valvular disease.   He needs to walk with cardiac rehab - doing this now.   Hypertension PTA was on olmesartan-HCTZ - this has not been restarted due to hypotension - giving additional gentle fluids this morning    AKI sCr  1.67 - previously in the 1.1-1.2 range Repeat CMP at 11AM after additional fluids   Hyperlipidemia with LDL goal < 70 09/03/2021: Cholesterol 225; HDL 29; LDL Cholesterol 161; Triglycerides 177; VLDL 35 Started on 80 mg lipitor. Will need repeat labs in 6 weeks. May need lipid clinic given LDL of 161.    Obesity Encouraged cardiac rehab and dietary changes.    Anticipate discharge later today.    For questions or updates, please contact [facility] Please consult [email or web address] for contact info under        Signed, Marcelino Dusterngela Nicole Duke, PA  09/04/2021, 8:21 AM    Patient examined chart reviewed exam benign no murmur Cr up 1.6 EF normal post stent to RCA.  Will f/u in [place]/AP.  Hydrate if repeat Cr 1.5 or less can go home today otherwise keep overnight with NS 120 cc/hr   Charlton HawsPeter Sakiyah Shur MD [facility]

## 2021-09-04 NOTE — Progress Notes (Signed)
CARDIAC REHAB PHASE I   PRE:  Rate/Rhythm: 73 SR  BP:  Sitting: 106/79      SaO2: 98 RA  MODE:  Ambulation: 400 ft   POST:  Rate/Rhythm: 88 SR  BP:  Sitting: 129/84    SaO2: 97 RA   Pt ambulated 439ft in hallway independently with steady gait. Pt states minimal SOB, but denies CP or dizziness. Pt assisted to BR and brush teeth then returned to bed. Pt and family educated on importance of ASA, Brilinta, statin, and NTG. Pt given MI book along with heart healthy diet. Reviewed site care, restrictions, and exercise guidelines. Will refer to CRP II [place].  [id number] Reynold Bowen, RN BSN 09/04/2021 8:52 AM

## 2021-09-04 NOTE — Discharge Summary (Signed)
Discharge Summary    Patient ID: Jason [name] MRN: [id number]; DOB: 18-Dec-1960  Admit date: 09/03/2021 Discharge date: 09/04/2021  PCP:  Estanislado Pandy, MD   [facility] Providers Cardiologist:  Dietrich Pates, MD   Discharge Diagnoses    Principal Problem:   NSTEMI (non-ST elevated myocardial infarction) [facility]) Active Problems:   HTN (hypertension)   Hyperlipidemia   Chest pain   AKI (acute kidney injury) [facility])    Diagnostic Studies/Procedures    Left heart cath 09/03/21:   Mid RCA lesion is 100% stenosed.   Prox Cx lesion is 30% stenosed.   1st Mrg lesion is 30% stenosed.   Mid LAD lesion is 40% stenosed.   Prox RCA lesion is 60% stenosed.   A drug-eluting stent was successfully placed using a SYNERGY XD 4.0X28.   Post intervention, there is a 0% residual stenosis.   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 Will start a beta blocker. Continue high intensity statin. Fast track discharge tomorrow if stable. Resume home ARB tomorrow.    ____________   Echo 09/03/21:  1. Akinesis of the basal inferior wall, hypokinesis of the basal  inferolataral wall. . Left ventricular ejection fraction, by estimation,  is 55 to 60%. The left ventricle has normal function. The left ventricle  demonstrates regional wall motion  abnormalities (see scoring diagram/findings for description). Left  ventricular diastolic parameters were normal.   2. Right ventricular systolic function is normal. The right ventricular  size is normal.   3. The mitral valve is normal in structure. Trivial mitral valve  regurgitation.   4. The aortic valve is normal in structure. Aortic valve regurgitation is  not visualized.     History of Present Illness     Jason [name] is a 61 y.o. male with hx of HTN who was seen for chest  pain at [facility] and transferred to [facility] for heart cath 09/03/21.   Jason [name] is a 61 yo with hx of HTN, HL   presented to [facility] ED with HTN. The pt says over the past month he has noticed increased SOB with exertion.   A couple days ago he had intermittent chest pains that were mild (2/10) in intensity and resolved spontaneously.  Last evening before bed, he had a severe CP (8-9/10) that was not pleuritic, but radiated to L arm and elbow. This was associated with SOB and he was unable to lay down. He sat up most of night and discomfort eventually eased off.  He went to work with mild discomfort and called PCP. He spoke to nurse since MD not in and was instructed to go to the ER.    Currently patient is CP free.   Hx HTN   BP has not been optimal he says    Hospital Course     Consultants: none  CAD Heart cath yesterday revealed 100% stenosis in the mid RCA successfully treated with DES. Otherwise, nonobstructive disease in the Cx, 1st marginal, and LAD. He was started on ASA and brilinta x 12 months. Metoprolol 25 mg BID was started.    Echo with normal LVEF 55-60%, RWMA, normal diastolic  function and normal RV fx, no significant valvular disease.    He ambulated well with cardiac rehab.     Hypertension PTA was on olmesartan-HCTZ - this has not been  restarted due to hypotension - given additional gentle fluids this morning  - hold olmesartan-HCTZ  at discharge - likely restart these at OP follow up     AKI sCr 1.67 - previously in the 1.1-1.2 range Repeat BMP at 11AM after additional fluids showed sCr was 1.57 (1.67). He will get a repeat lab on Tues of next week. No ACEI/ARB.      Hyperlipidemia with LDL goal < 70 09/03/2021: Cholesterol 225; HDL 29; LDL Cholesterol 161; Triglycerides 177; VLDL 35 Started on 80 mg lipitor. Will need repeat labs in 6 weeks. May need lipid clinic given LDL of 161.      Obesity Encouraged cardiac rehab and dietary changes.  Screen for need for sleep  study.   Pt seen and examined by Dr. [facility] Emms and deemed stable for discharge. Follow up has been arranged.  Did the patient have an acute coronary syndrome (MI, NSTEMI, STEMI, etc) this admission?:  Yes                               AHA/ACC Clinical Performance & Quality Measures: Aspirin prescribed? - Yes ADP Receptor Inhibitor (Plavix/Clopidogrel, Brilinta/Ticagrelor or Effient/Prasugrel) prescribed (includes medically managed patients)? - Yes Beta Blocker prescribed? - Yes High Intensity Statin (Lipitor 40-80mg  or Crestor 20-40mg ) prescribed? - Yes EF assessed during THIS hospitalization? - Yes For EF <40%, was ACEI/ARB prescribed? - No - Reason:  BP For EF <40%, Aldosterone Antagonist (Spironolactone or Eplerenone) prescribed? - No - Reason:  BP Cardiac Rehab Phase II ordered (including medically managed patients)? - Yes       The patient will be scheduled for a TOC follow up appointment in 7-10 days.  A message has been sent to the [facility] and Scheduling Pool at the office where the patient should be seen for follow up.  _____________  Discharge Vitals Blood pressure 115/68, pulse 83, temperature 98.5 F (36.9 C), temperature source Oral, resp. rate 14, height 6' (1.829 m), weight 106.6 kg, SpO2 98 %.  Filed Weights   09/03/21 1119  Weight: 106.6 kg    Labs & Radiologic Studies    CBC Recent Labs    09/03/21 1144 09/04/21 0219  WBC 7.1 9.2  HGB 15.5 13.9  HCT 45.3 39.7  MCV 83.9 84.3  PLT 201 195   Basic Metabolic Panel Recent Labs    18/84/16 0219 09/04/21 1043  NA 135 137  K 3.9 3.6  CL 102 106  CO2 23 24  GLUCOSE 140* 124*  BUN 19 21*  CREATININE 1.67* 1.57*  CALCIUM 8.9 8.8*   Liver Function Tests No results for input(s): AST, ALT, ALKPHOS, BILITOT, PROT, ALBUMIN in the last 72 hours. No results for input(s): LIPASE, AMYLASE in the last 72 hours. High Sensitivity Troponin:   Recent Labs  Lab 09/03/21 1144 09/03/21 1310  TROPONINIHS 12,799*  12,903*    BNP Invalid input(s): POCBNP D-Dimer No results for input(s): DDIMER in the last 72 hours. Hemoglobin A1C No results for input(s): HGBA1C in the last 72 hours. Fasting Lipid Panel Recent Labs    09/03/21 1340  CHOL 225*  HDL 29*  LDLCALC 161*  TRIG 177*  CHOLHDL 7.8   Thyroid Function Tests No results for input(s): TSH, T4TOTAL, T3FREE, THYROIDAB in the last 72 hours.  Invalid input(s): FREET3 _____________  CARDIAC CATHETERIZATION  Result Date: 09/03/2021   Mid RCA lesion is 100% stenosed.   Prox Cx lesion is 30%  stenosed.   1st Mrg lesion is 30% stenosed.   Mid LAD lesion is 40% stenosed.   Prox RCA lesion is 60% stenosed.   A drug-eluting stent was successfully placed using a SYNERGY XD 4.0X28.   Post intervention, there is a 0% residual stenosis. 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 Will start a beta blocker. Continue high intensity statin. Fast track discharge tomorrow if stable. Resume home ARB tomorrow.   DG Chest Portable 1 View  Result Date: 09/03/2021 CLINICAL DATA:  Chest pain. EXAM: PORTABLE CHEST 1 VIEW COMPARISON:  Chest x-ray dated March 21, 2016. FINDINGS: The heart size and mediastinal contours are within normal limits. Both lungs are clear. The visualized skeletal structures are unremarkable. IMPRESSION: No active disease. Electronically Signed   By: Obie DredgeWilliam T Derry M.D.   On: 09/03/2021 11:42   ECHOCARDIOGRAM COMPLETE  Result Date: 09/03/2021    ECHOCARDIOGRAM REPORT   Patient Name:   Jason [name] Date of Exam: 09/03/2021 Medical Rec #:  [id number]        Height:       72.0 in Accession #:    [id number]       Weight:       235.0 lb Date of Birth:  03/31/1961        BSA:          2.282 m Patient Age:    60 years         BP:           138/98 mmHg Patient Gender: M                HR:            81 bpm. [facility] Procedure: 2D Echo, Cardiac Doppler and Color Doppler Indications:    Elevated Troponin  History:        Patient has no prior history of Echocardiogram examinations.                 Previous Myocardial Infarction; Risk Factors:Hypertension and                 Dyslipidemia.  Sonographer:    Celesta GentileBernard White RCS Referring Phys: [id number] PAULA V ROSS IMPRESSIONS  1. Akinesis of the basal inferior wall, hypokinesis of the basal inferolataral wall. . Left ventricular ejection fraction, by estimation, is 55 to 60%. The left ventricle has normal function. The left ventricle demonstrates regional wall motion abnormalities (see scoring diagram/findings for description). Left ventricular diastolic parameters were normal.  2. Right ventricular systolic function is normal. The right ventricular size is normal.  3. The mitral valve is normal in structure. Trivial mitral valve regurgitation.  4. The aortic valve is normal in structure. Aortic valve regurgitation is not visualized. FINDINGS  Left Ventricle: Akinesis of the basal inferior wall, hypokinesis of the basal inferolataral wall. Left ventricular ejection fraction, by estimation, is 55 to 60%. The left ventricle has normal function. The left ventricle demonstrates regional wall motion abnormalities. Definity contrast agent was given IV to delineate the left ventricular endocardial borders. The left ventricular internal cavity size was normal in size. There is no left ventricular hypertrophy. Left ventricular diastolic parameters were normal. Right Ventricle: The right ventricular size is normal. Right vetricular wall thickness was not assessed. Right ventricular systolic function is normal. Left Atrium: Left atrial  size was normal in size. Right Atrium: Right atrial size was normal in size. Pericardium: There is no evidence of pericardial effusion. Mitral Valve: The mitral valve is normal in structure. Trivial mitral valve regurgitation.  Tricuspid Valve: The tricuspid valve is normal in structure. Tricuspid valve regurgitation is trivial. Aortic Valve: The aortic valve is normal in structure. Aortic valve regurgitation is not visualized. Pulmonic Valve: The pulmonic valve was normal in structure. Pulmonic valve regurgitation is trivial. Aorta: The aortic root was not well visualized. IAS/Shunts: No atrial level shunt detected by color flow Doppler.  LEFT VENTRICLE PLAX 2D LVIDd:         4.60 cm   Diastology LVIDs:         3.30 cm   LV e' medial:    5.22 cm/s LV PW:         1.00 cm   LV E/e' medial:  13.2 LV IVS:        1.00 cm   LV e' lateral:   9.14 cm/s LVOT diam:     2.00 cm   LV E/e' lateral: 7.5 LV SV:         69 LV SV Index:   30 LVOT Area:     3.14 cm  RIGHT VENTRICLE RV S prime:     16.40 cm/s TAPSE (M-mode): 2.3 cm LEFT ATRIUM             Index        RIGHT ATRIUM           Index LA diam:        3.30 cm 1.45 cm/m   RA Area:     12.90 cm LA Vol (A2C):   48.0 ml 21.04 ml/m  RA Volume:   33.10 ml  14.51 ml/m LA Vol (A4C):   55.7 ml 24.41 ml/m LA Biplane Vol: 55.9 ml 24.50 ml/m  AORTIC VALVE LVOT Vmax:   117.00 cm/s LVOT Vmean:  68.800 cm/s LVOT VTI:    0.220 m  AORTA Ao Root diam: 3.10 cm MITRAL VALVE MV Area (PHT): 2.99 cm    SHUNTS MV Decel Time: 254 msec    Systemic VTI:  0.22 m MV E velocity: 69.00 cm/s  Systemic Diam: 2.00 cm MV A velocity: 90.80 cm/s MV E/A ratio:  0.76 Dietrich Pates MD Electronically signed by Dietrich Pates MD Signature Date/Time: 09/03/2021/3:39:48 PM    Final    Disposition   Pt is being discharged home today in good condition.  Follow-up Plans & Appointments     Follow-up Information     Ronney Asters, NP Follow up on 09/10/2021.   Specialty: Cardiology Why: 2:20 for hospital follow up Contact information: [address] [place] [address] [phone]         [facility] Follow up on 09/07/2021.   Specialty: Cardiology Why: please present to the  office for BMP, you do not need to be fasting for this blood test Contact information: [address] [place] [address] [phone]               Discharge Instructions     Amb Referral to Cardiac Rehabilitation   Complete by: As directed    Diagnosis:  Coronary Stents NSTEMI     After initial evaluation and assessments completed: Virtual Based Care may be provided alone or in conjunction with Phase 2 Cardiac Rehab based on patient barriers.: Yes   Diet -  low sodium heart healthy   Complete by: As directed    Discharge instructions   Complete by: As directed    No driving for 2 days. No lifting over 5 lbs for 1 week. No sexual activity for 1 week. You should stay out of work until seen in follow up. Keep procedure site clean & dry. If you notice increased pain, swelling, bleeding or pus, call/return!  You may shower, but no soaking baths/hot tubs/pools for 1 week.   Increase activity slowly   Complete by: As directed        Discharge Medications   Allergies as of 09/04/2021       Reactions   Lisinopril    No Known Allergies         Medication List     STOP taking these medications    apixaban 2.5 MG Tabs tablet Commonly known as: ELIQUIS   olmesartan-hydrochlorothiazide 40-25 MG tablet Commonly known as: BENICAR HCT   oxyCODONE-acetaminophen 5-325 MG tablet Commonly known as: Roxicet       TAKE these medications    aspirin 81 MG EC tablet Take 1 tablet (81 mg total) by mouth daily. Swallow whole. Start taking on: September 05, 2021   atorvastatin 80 MG tablet Commonly known as: LIPITOR Take 1 tablet (80 mg total) by mouth daily. Start taking on: September 05, 2021   Fish Oil 1200 MG Caps Take 1,200 mg by mouth daily.   metoprolol tartrate 25 MG tablet Commonly known as: LOPRESSOR Take 1 tablet (25 mg total) by mouth 2 (two) times daily.   nitroGLYCERIN 0.4 MG SL tablet Commonly known as: NITROSTAT Place 1 tablet (0.4  mg total) under the tongue every 5 (five) minutes x 3 doses as needed for chest pain.   polyethylene glycol 17 g packet Commonly known as: MIRALAX / GLYCOLAX Take 17 g by mouth daily as needed for mild constipation.   ticagrelor 90 MG Tabs tablet Commonly known as: BRILINTA Take 1 tablet (90 mg total) by mouth 2 (two) times daily.           Outstanding Labs/Studies   Check BMP on Tues  Restart ARB-HCTZ?  Recheck lipids in 6 weeks   Duration of Discharge Encounter   Greater than 30 minutes including physician time.  Signed, Marcelino DusterAngela Nicole Janny Crute, PA 09/04/2021, 12:48 PM

## 2021-09-04 NOTE — Progress Notes (Addendum)
Site area: right groin  Site Prior to Removal:  Level 1 (sl.bruise around the site)  Pressure Applied For 25 MINUTES    Minutes Beginning at  2051  Manual:   Yes.    Patient Status During Pull:  Pt.alert ,oriented x 4..Had episode of vaso vagal while putting pressure on the femoral site B/P=84/52;hr=64;& pt.c/o nausea & cold clammy.NS bolus 250 cc given ;B/p 110/72  Post Pull Groin Site:  Level 1(bruise around the site   Post Pull Instructions Given:  Yes.    Post Pull Pulses Present:  Yes.    Dressing Applied:  Yes.    Comments:  Pt.tolerated procedure well

## 2021-09-06 ENCOUNTER — Encounter (HOSPITAL_COMMUNITY): Payer: Self-pay | Admitting: Cardiovascular Disease

## 2021-09-06 ENCOUNTER — Telehealth: Payer: Self-pay | Admitting: *Deleted

## 2021-09-06 NOTE — Telephone Encounter (Signed)
Patient contacted regarding discharge from [facility] on 09/04/2021.  Patient understands to follow up with provider Edd Fabian, NP on 09/10/2021 at 2:20 PM at [facility]. Patient understands discharge instructions? yes Patient understands medications and regiment? yes Patient understands to bring all medications to this visit? yes

## 2021-09-06 NOTE — Telephone Encounter (Signed)
-----   Message from Levonne Hubert, LPN sent at [id number]  9:18 AM EST -----  ----- Message ----- From: Ledora Bottcher, PA Sent: 09/04/2021  10:36 AM EST To: Cristi Loron Toc  Pt discharging today, will need a TOC phone call on Monday.  Thanks Angie

## 2021-09-07 ENCOUNTER — Other Ambulatory Visit (HOSPITAL_COMMUNITY)
Admission: RE | Admit: 2021-09-07 | Discharge: 2021-09-07 | Disposition: A | Discharge status: Home / Self Care | Payer: BC Managed Care – PPO | Source: Ambulatory Visit | Attending: General Practice | Admitting: General Practice

## 2021-09-07 ENCOUNTER — Other Ambulatory Visit: Payer: Self-pay | Admitting: *Deleted

## 2021-09-07 DIAGNOSIS — N179 Acute kidney failure, unspecified: Secondary | ICD-10-CM | POA: Diagnosis not present

## 2021-09-07 DIAGNOSIS — I10 Essential (primary) hypertension: Secondary | ICD-10-CM | POA: Diagnosis not present

## 2021-09-07 LAB — BASIC METABOLIC PANEL
Anion gap: 8 (ref 5–15)
BUN: 27 mg/dL — ABNORMAL HIGH (ref 6–20)
CO2: 26 mmol/L (ref 22–32)
Calcium: 9.4 mg/dL (ref 8.9–10.3)
Chloride: 107 mmol/L (ref 98–111)
Creatinine, Ser: 1.39 mg/dL — ABNORMAL HIGH (ref 0.61–1.24)
GFR, Estimated: 59 mL/min — ABNORMAL LOW (ref >60)
Glucose, Bld: 102 mg/dL — ABNORMAL HIGH (ref 70–99)
Potassium: 4 mmol/L (ref 3.5–5.1)
Sodium: 141 mmol/L (ref 135–145)

## 2021-09-08 NOTE — Progress Notes (Signed)
Cardiology Clinic Note   Patient Name: Jason [name] Date of Encounter: 09/10/2021  Primary Care Provider:  Manon Hilding, MD Primary Cardiologist:  Dorris Carnes, MD  Patient Profile    Jason [name] 61 year old male presents to the clinic today for follow-up evaluation of his NSTEMI.  Past Medical History    Past Medical History:  Diagnosis Date   Arthritis    Headache    migraines    last one "been awhile" 6 mths or so   HTN (hypertension)    Hyperlipidemia    Past Surgical History:  Procedure Laterality Date   CORONARY/GRAFT ACUTE MI REVASCULARIZATION N/A 09/03/2021   Procedure: CORONARY/GRAFT ACUTE MI REVASCULARIZATION;  Surgeon: Burnell Blanks, MD;  Location: [facility];  Service: Cardiovascular;  Laterality: N/A;   KNEE ARTHROSCOPY Left    3 on left and 1 on the right   LEFT HEART CATH AND CORONARY ANGIOGRAPHY N/A 09/03/2021   Procedure: LEFT HEART CATH AND CORONARY ANGIOGRAPHY;  Surgeon: Burnell Blanks, MD;  Location: [facility];  Service: Cardiovascular;  Laterality: N/A;   TONSILLECTOMY     TOTAL KNEE ARTHROPLASTY Left 04/01/2016   Procedure: TOTAL KNEE ARTHROPLASTY;  Surgeon: Earlie Server, MD;  Location: [facility];  Service: Orthopedics;  Laterality: Left;    Allergies  Allergies  Allergen Reactions   Lisinopril    No Known Allergies     History of Present Illness    [name] has a PMH of coronary artery disease status post NSTEMI, OA, AKI, and hyperlipidemia.  He initially presented to [facility] and transferred to [facility].  He was taken emergently to the cardiac Cath Lab on 09/03/2021.  He was noted to have 100% stenosis of his mid RCA and received PCI with DES.  He was also noted to have nonobstructive disease in his circumflex, first marginal, and LAD.  He was placed on aspirin and Brilinta along with metoprolol.  His echocardiogram showed an LVEF of 55-60%, regional wall motion abnormalities, and  normal diastolic function.  He was not noted to have significant valvular disease.  His olmesartan/HCTZ was held due to hypotension.  He was noted to have a serum creatinine of 1.67 with a previous baseline of around 1.1-1.2.  He was not placed on ACE ARB or Arni due to increased creatinine.  He is LDL cholesterol was noted to be 161 and he was placed on atorvastatin 80.  He presents the clinic today for follow-up evaluation states he feels well.  He does report 1 episode of heartburn after eating [employer] 2 nights ago.  He has been walking daily and denies exertional chest pain.  He has been checking his blood pressure at home which has been in the 130s over 80s.  It is elevated somewhat clinic today.  I have asked him to maintain a blood pressure log and contact the office if his blood pressure increases.  We reviewed his angiography and he expressed understanding.  We reviewed the importance of modifiable risk factors.  I will give him a salty 6 diet sheet, have him continue to increase his physical activity as tolerated, repeat his fasting lipids and LFTs in 4 to 6 weeks, get a blood pressure log, and plan follow-up for 3 to 4 months.  He he reports compliance with his medications and is tolerating it well without side effects.  Today he denies chest pain, shortness of breath, lower extremity edema, fatigue, palpitations, melena, hematuria, hemoptysis, diaphoresis, weakness,  presyncope, syncope, orthopnea, and PND.  Home Medications    Prior to Admission medications   Medication Sig Start Date End Date Taking? Authorizing Provider  aspirin EC 81 MG EC tablet Take 1 tablet (81 mg total) by mouth daily. Swallow whole. 09/05/21   [facility], Tami Lin, PA  atorvastatin (LIPITOR) 80 MG tablet Take 1 tablet (80 mg total) by mouth daily. 09/05/21   [facility], Tami Lin, PA  metoprolol tartrate (LOPRESSOR) 25 MG tablet Take 1 tablet (25 mg total) by mouth 2 (two) times daily. 09/04/21   [facility], Tami Lin, PA   nitroGLYCERIN (NITROSTAT) 0.4 MG SL tablet Place 1 tablet (0.4 mg total) under the tongue every 5 (five) minutes x 3 doses as needed for chest pain. 09/04/21   [facility], Tami Lin, PA  Omega-3 Fatty Acids (FISH OIL) 1200 MG CAPS Take 1,200 mg by mouth daily.    [provider]  polyethylene glycol (MIRALAX / GLYCOLAX) packet Take 17 g by mouth daily as needed for mild constipation. Patient not taking: Reported on 09/03/2021 04/02/16   Ainsley Spinner, PA-C  ticagrelor (BRILINTA) 90 MG TABS tablet Take 1 tablet (90 mg total) by mouth 2 (two) times daily. 09/04/21   [facility], Tami Lin, PA    Family History    No family history on file. has no family status information on file.   Social History    Social History   Socioeconomic History   Marital status: Unknown    Spouse name: Not on file   Number of children: Not on file   Years of education: Not on file   Highest education level: Not on file  Occupational History   Not on file  Tobacco Use   Smoking status: Never   Smokeless tobacco: Never  Substance and Sexual Activity   Alcohol use: Yes    Alcohol/week: 10.0 standard drinks    Types: 10 Cans of beer per week   Drug use: No   Sexual activity: Not on file  Other Topics Concern   Not on file  Social History Narrative   Not on file   Social Determinants of Health   Financial Resource Strain: Not on file  Food Insecurity: Not on file  Transportation Needs: Not on file  Physical Activity: Not on file  Stress: Not on file  Social Connections: Not on file  Intimate Partner Violence: Not on file     Review of Systems    General:  No chills, fever, night sweats or weight changes.  Cardiovascular:  No chest pain, dyspnea on exertion, edema, orthopnea, palpitations, paroxysmal nocturnal dyspnea. Dermatological: No rash, lesions/masses Respiratory: No cough, dyspnea Urologic: No hematuria, dysuria Abdominal:   No nausea, vomiting, diarrhea, bright red blood per rectum,  melena, or hematemesis Neurologic:  No visual changes, wkns, changes in mental status. All other systems reviewed and are otherwise negative except as noted above.  Physical Exam    VS:  BP (!) 144/80 (BP Location: Left Arm, Patient Position: Sitting)    Pulse 60    Ht 6' (1.829 m)    Wt 240 lb 6.4 oz (109 kg)    SpO2 98%    BMI 32.60 kg/m  , BMI Body mass index is 32.6 kg/m. GEN: Well nourished, well developed, in no acute distress. HEENT: normal. Neck: Supple, no JVD, carotid bruits, or masses. Cardiac: RRR, no murmurs, rubs, or gallops. No clubbing, cyanosis, edema.  Radials/DP/PT 2+ and equal bilaterally.  Respiratory:  Respirations regular and unlabored, clear to  auscultation bilaterally. GI: Soft, nontender, nondistended, BS + x 4. MS: no deformity or atrophy. Skin: warm and dry, no rash. Neuro:  Strength and sensation are intact. Psych: Normal affect.  Accessory Clinical Findings    Recent Labs: 09/03/2021: B Natriuretic Peptide 65.0 09/04/2021: Hemoglobin 13.9; Platelets 195 09/07/2021: BUN 27; Creatinine, Ser 1.39; Potassium 4.0; Sodium 141   Recent Lipid Panel    Component Value Date/Time   CHOL 225 (H) 09/03/2021 1340   TRIG 177 (H) 09/03/2021 1340   HDL 29 (L) 09/03/2021 1340   CHOLHDL 7.8 09/03/2021 1340   VLDL 35 09/03/2021 1340   LDLCALC 161 (H) 09/03/2021 1340    ECG personally reviewed by me today-sinus rhythm with premature supraventricular complexes and premature ventricular complexes or fusion complexes 60 bpm- No acute changes  Echocardiogram 09/03/21 IMPRESSIONS     1. Akinesis of the basal inferior wall, hypokinesis of the basal  inferolataral wall. . Left ventricular ejection fraction, by estimation,  is 55 to 60%. The left ventricle has normal function. The left ventricle  demonstrates regional wall motion  abnormalities (see scoring diagram/findings for description). Left  ventricular diastolic parameters were normal.   2. Right ventricular systolic  function is normal. The right ventricular  size is normal.   3. The mitral valve is normal in structure. Trivial mitral valve  regurgitation.   4. The aortic valve is normal in structure. Aortic valve regurgitation is  not visualized.   Cardiac catheterization 09/03/2021    Mid RCA lesion is 100% stenosed.   Prox Cx lesion is 30% stenosed.   1st Mrg lesion is 30% stenosed.   Mid LAD lesion is 40% stenosed.   Prox RCA lesion is 60% stenosed.   A drug-eluting stent was successfully placed using a SYNERGY XD [id number].   Post intervention, there is a 0% residual stenosis.   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 Will start a beta blocker. Continue high intensity statin. Fast track discharge tomorrow if stable. Resume home ARB tomorrow.   Diagnostic Dominance: Right Intervention   Assessment & Plan   1.  NSTEMI-denies chest discomfort since being discharged from the hospital.  Underwent cardiac catheterization 09/03/2021 which showed 100% stenosis of mid RCA which was treated with PCI and DES.  She was noted to have nonobstructive circumflex, first marginal and LAD disease. Continue aspirin, Brilinta, metoprolol, atorvastatin Heart healthy low-sodium diet-salty 6 given Increase physical activity as tolerated  Hyperlipidemia-09/03/2021: Cholesterol 225; HDL 29; LDL Cholesterol 161; Triglycerides 177; VLDL 35 Continue atorvastatin, aspirin Heart healthy low-sodium high-fiber diet Increase physical activity as tolerated Repeat fasting lipids and LFTs 4-6 weeks.   Essential hypertension-BP today 144/80.  Olmesartan-HCTZ was not restarted to the hospital due to hypotension.  Blood pressure at home 130s over 80 Continue metoprolol Heart healthy low-sodium diet Maintain blood pressure log-we will contact office if blood  pressure increases.  AKI-serum creatinine 1.67 while in hospital.  Baseline creatinine 1.1-1.2. Follows with PCP  Obesity-weight today 260.4.  Concern for OSA. Continue weight loss Order split-night sleep study-Epworth 3  Disposition: Follow-up with Dr. Harrington Challenger or APP in 3-4 months.  Jossie Ng. Haedyn Ancrum NP-C    09/10/2021, 2:47 PM [address] Office [phone] Fax [phone]  Notice: This dictation was prepared with Dragon dictation along with smaller phrase technology.  Any transcriptional errors that result from this process are unintentional and may not be corrected upon review.  I spent 14 minutes examining this patient, reviewing medications, and using patient centered shared decision making involving her cardiac care.  Prior to her visit I spent greater than 20 minutes reviewing her past medical history,  medications, and prior cardiac tests.

## 2021-09-10 ENCOUNTER — Encounter: Payer: Self-pay | Admitting: *Deleted

## 2021-09-10 ENCOUNTER — Encounter: Payer: Self-pay | Admitting: General Practice

## 2021-09-10 ENCOUNTER — Ambulatory Visit (INDEPENDENT_AMBULATORY_CARE_PROVIDER_SITE_OTHER): Payer: BC Managed Care – PPO | Admitting: General Practice

## 2021-09-10 ENCOUNTER — Other Ambulatory Visit: Payer: Self-pay

## 2021-09-10 VITALS — BP 144/80 | HR 60 | Ht 72.0 in | Wt 240.4 lb

## 2021-09-10 DIAGNOSIS — I214 Non-ST elevation (NSTEMI) myocardial infarction: Secondary | ICD-10-CM

## 2021-09-10 DIAGNOSIS — E782 Mixed hyperlipidemia: Secondary | ICD-10-CM | POA: Diagnosis not present

## 2021-09-10 DIAGNOSIS — I10 Essential (primary) hypertension: Secondary | ICD-10-CM

## 2021-09-10 DIAGNOSIS — N179 Acute kidney failure, unspecified: Secondary | ICD-10-CM | POA: Diagnosis not present

## 2021-09-10 DIAGNOSIS — Z79899 Other long term (current) drug therapy: Secondary | ICD-10-CM

## 2021-09-10 DIAGNOSIS — Z006 Encounter for examination for normal comparison and control in clinical research program: Secondary | ICD-10-CM

## 2021-09-10 NOTE — Patient Instructions (Signed)
Medication Instructions:  The current medical regimen is effective;  continue present plan and medications as directed. Please refer to the Current Medication list given to you today.   *If you need a refill on your cardiac medications before your next appointment, please call your pharmacy*  Lab Work: FASTING LIPID AND LFT IN 4-6 WEEKS If you have labs (blood work) drawn today and your tests are completely normal, you will receive your results only by:  MyChart Message (if you have MyChart) OR A paper copy in the mail.  If you have any lab test that is abnormal or we need to change your treatment, we will call you to review the results. You may go to any [facility] that is convenient for you however, we do have a lab in our office that is able to assist you. You DO NOT need an appointment for our lab. The lab is open 8:00am and closes at 4:00pm. Lunch 12:45 - 1:45pm.  Special Instructions INCREASE THE FIBER IN YOUR DIET-SEE ATTACHED  PLEASE READ AND FOLLOW SALTY 6-ATTACHED-1,800 mg daily  PLEASE INCREASE PHYSICAL ACTIVITY AS TOLERATED GOAL IS 150 MINUTES OF MODERATE PHYSICAL ACTIVITY PER WEEK  TAKE AND LOG YOUR BLOOD PRESSURE-MAKE SURE YOU TAKE YOUR BLOOD PRESSURE AT LEAST 1 HOUR AFTER TAKING YOUR MEDICATIONS  IF YOUR BLOOD PRESSURE IS > THAN UPPER 130'S/80'S CALL TO DISCUSS.  Follow-Up: Your next appointment:  3-4 month(s) In Person with Dietrich Pates, MD    At [facility], you and your health needs are our priority.  As part of our continuing mission to provide you with exceptional heart care, we have created designated Provider Care Teams.  These Care Teams include your primary Cardiologist (physician) and Advanced Practice Providers (APPs -  Physician Assistants and Nurse Practitioners) who all work together to provide you with the care you need, when you need it.         High-Fiber Eating Plan Fiber, also called dietary fiber, is a type of carbohydrate. It is found foods such as  fruits, vegetables, whole grains, and beans. A high-fiber diet can have many health benefits. Your health care provider may recommend a high-fiber diet to help: Prevent constipation. Fiber can make your bowel movements more regular. Lower your cholesterol. Relieve the following conditions: Inflammation of veins in the anus (hemorrhoids). Inflammation of specific areas of the digestive tract (uncomplicated diverticulosis). A problem of the large intestine, also called the colon, that sometimes causes pain and diarrhea (irritable bowel syndrome, or IBS). Prevent overeating as part of a weight-loss plan. Prevent heart disease, type 2 diabetes, and certain cancers. What are tips for following this plan? Reading food labels   Check the nutrition facts label on food products for the amount of dietary fiber. Choose foods that have 5 grams of fiber or more per serving.  Shopping Choose whole fruits and vegetables instead of processed forms, such as apple juice or applesauce. Choose a wide variety of high-fiber foods such as avocados, lentils, oats, and kidney beans. Read the nutrition facts label of the foods you choose. Be aware of foods with added fiber. These foods often have high sugar and sodium amounts per serving. Cooking Use whole-grain flour for baking and cooking. Cook with brown rice instead of white rice. Meal planning Start the day with a breakfast that is high in fiber, such as a cereal that contains 5 g of fiber or more per serving. Eat breads and cereals that are made with whole-grain flour instead of refined  flour or white flour. Eat brown rice, bulgur wheat, or millet instead of white rice. Use beans in place of meat in soups, salads, and pasta dishes. Be sure that half of the grains you eat each day are whole grains. General information You can get the recommended daily intake of dietary fiber by: Eating a variety of fruits, vegetables, grains, nuts, and beans. Taking a  fiber supplement if you are not able to take in enough fiber in your diet. It is better to get fiber through food than from a supplement. Gradually increase how much fiber you consume. If you increase your intake of dietary fiber too quickly, you may have bloating, cramping, or gas. Drink plenty of water to help you digest fiber. Choose high-fiber snacks, such as berries, raw vegetables, nuts, and popcorn. What foods should I eat? Fruits Berries. Pears. Apples. Oranges. Avocado. Prunes and raisins. Dried figs. Vegetables Sweet potatoes. Spinach. Kale. Artichokes. Cabbage. Broccoli. Cauliflower. Green peas. Carrots. Squash. Grains Whole-grain breads. Multigrain cereal. Oats and oatmeal. Brown rice. Barley. Bulgur wheat. Millet. Quinoa. Bran muffins. Popcorn. Rye wafer crackers. Meats and other proteins Navy beans, kidney beans, and pinto beans. Soybeans. Split peas. Lentils. Nuts and seeds. Dairy Fiber-fortified yogurt. Beverages Fiber-fortified soy milk. Fiber-fortified orange juice. Other foods Fiber bars. The items listed above may not be a complete list of recommended foods and beverages. Contact a dietitian for more information. What foods should I avoid? Fruits Fruit juice. Cooked, strained fruit. Vegetables Fried potatoes. Canned vegetables. Well-cooked vegetables. Grains White bread. Pasta made with refined flour. White rice. Meats and other proteins Fatty cuts of meat. Fried chicken or fried fish. Dairy Milk. Yogurt. Cream cheese. Sour cream. Fats and oils Butters. Beverages Soft drinks. Other foods Cakes and pastries. The items listed above may not be a complete list of foods and beverages to avoid. Talk with your dietitian about what choices are best for you. Summary Fiber is a type of carbohydrate. It is found in foods such as fruits, vegetables, whole grains, and beans. A high-fiber diet has many benefits. It can help to prevent constipation, lower blood  cholesterol, aid weight loss, and reduce your risk of heart disease, diabetes, and certain cancers. Increase your intake of fiber gradually. Increasing fiber too quickly may cause cramping, bloating, and gas. Drink plenty of water while you increase the amount of fiber you consume. The best sources of fiber include whole fruits and vegetables, whole grains, nuts, seeds, and beans. This information is not intended to replace advice given to you by your health care provider. Make sure you discuss any questions you have with your health care provider. Document Revised: 11/21/2019 Document Reviewed: 11/21/2019 Elsevier Patient Education  2022 [employer].

## 2021-09-10 NOTE — Research (Signed)
V-Inception Research Study  Patient Contacted about potential participation in [employer] V-Inception.  Study was discussed with the patient and the opportunity to ask questions was given.  Patient declined interest in participation.  This patient is eligible to participate in V-Inception Study.  The study is comparing the initiation of Inclisiran on top of usual care in Patient's with a recent acute coronary syndrome within the past 5 weeks.  Eligibility Criteria: recent ACS within 5 weeks, Serum LCL-C greater than or equal to 70mg /dL or non-HDL-C greater than or equal to 100mg /dL, and taking statin therapy or statin intolerant (not on PCSK9 Inhibitors).  Its a randomized, controlled, multicenter, open-label trial comparing a hospital post-discharge care pathway involving aggressive LCL-C management that includes Inclisiran with usual care versus usual care alone in patients with a recent ACS.   Jasmine Pang, RN BSN [facility] Cardiovascular Research & Education Direct Line: [phone]

## 2021-10-09 ENCOUNTER — Other Ambulatory Visit: Payer: Self-pay | Admitting: Student

## 2021-10-09 ENCOUNTER — Emergency Department (HOSPITAL_COMMUNITY)
Admission: EM | Admit: 2021-10-09 | Discharge: 2021-10-09 | Disposition: A | Discharge status: Home / Self Care | Payer: BC Managed Care – PPO | Attending: Emergency Medicine | Admitting: Emergency Medicine

## 2021-10-09 ENCOUNTER — Encounter (HOSPITAL_COMMUNITY): Payer: Self-pay | Admitting: *Deleted

## 2021-10-09 ENCOUNTER — Emergency Department (HOSPITAL_COMMUNITY): Payer: BC Managed Care – PPO

## 2021-10-09 ENCOUNTER — Other Ambulatory Visit: Payer: Self-pay

## 2021-10-09 DIAGNOSIS — I129 Hypertensive chronic kidney disease with stage 1 through stage 4 chronic kidney disease, or unspecified chronic kidney disease: Secondary | ICD-10-CM

## 2021-10-09 DIAGNOSIS — R0789 Other chest pain: Secondary | ICD-10-CM | POA: Diagnosis not present

## 2021-10-09 DIAGNOSIS — I2510 Atherosclerotic heart disease of native coronary artery without angina pectoris: Secondary | ICD-10-CM | POA: Diagnosis not present

## 2021-10-09 DIAGNOSIS — Z79899 Other long term (current) drug therapy: Secondary | ICD-10-CM | POA: Diagnosis not present

## 2021-10-09 DIAGNOSIS — R079 Chest pain, unspecified: Secondary | ICD-10-CM | POA: Diagnosis not present

## 2021-10-09 DIAGNOSIS — I10 Essential (primary) hypertension: Secondary | ICD-10-CM | POA: Diagnosis not present

## 2021-10-09 DIAGNOSIS — Z7982 Long term (current) use of aspirin: Secondary | ICD-10-CM | POA: Insufficient documentation

## 2021-10-09 DIAGNOSIS — R7989 Other specified abnormal findings of blood chemistry: Secondary | ICD-10-CM | POA: Insufficient documentation

## 2021-10-09 HISTORY — DX: Atherosclerotic heart disease of native coronary artery without angina pectoris: I25.10

## 2021-10-09 LAB — BASIC METABOLIC PANEL
Anion gap: 9 (ref 5–15)
BUN: 14 mg/dL (ref 6–20)
CO2: 26 mmol/L (ref 22–32)
Calcium: 9.6 mg/dL (ref 8.9–10.3)
Chloride: 105 mmol/L (ref 98–111)
Creatinine, Ser: 1.26 mg/dL — ABNORMAL HIGH (ref 0.61–1.24)
GFR, Estimated: >60 mL/min (ref >60)
Glucose, Bld: 133 mg/dL — ABNORMAL HIGH (ref 70–99)
Potassium: 3.9 mmol/L (ref 3.5–5.1)
Sodium: 140 mmol/L (ref 135–145)

## 2021-10-09 LAB — CBC
HCT: 43.7 % (ref 39.0–52.0)
Hemoglobin: 15.1 g/dL (ref 13.0–17.0)
MCH: 29.4 pg (ref 26.0–34.0)
MCHC: 34.6 g/dL (ref 30.0–36.0)
MCV: 85 fL (ref 80.0–100.0)
Platelets: 179 10*3/uL (ref 150–400)
RBC: 5.14 MIL/uL (ref 4.22–5.81)
RDW: 12.5 % (ref 11.5–15.5)
WBC: 7.8 10*3/uL (ref 4.0–10.5)
nRBC: 0 % (ref 0.0–0.2)

## 2021-10-09 LAB — TROPONIN I (HIGH SENSITIVITY)
Troponin I (High Sensitivity): 8 ng/L (ref <18)
Troponin I (High Sensitivity): 8 ng/L (ref <18)

## 2021-10-09 MED ORDER — PANTOPRAZOLE SODIUM 20 MG PO TBEC
20.0000 mg | DELAYED_RELEASE_TABLET | Freq: Every day | ORAL | 1 refills | Status: DC
Start: 2021-10-09 — End: 2022-06-28

## 2021-10-09 MED ORDER — ASPIRIN 81 MG PO CHEW
243.0000 mg | CHEWABLE_TABLET | Freq: Once | ORAL | Status: AC
  Administered 2021-10-09: 243 mg via ORAL
  Filled 2021-10-09: qty 3

## 2021-10-09 NOTE — ED Provider Notes (Signed)
[facility] Provider Note   CSN: [id number] Arrival date & time: 10/09/21  [id number]     History  Chief Complaint  Patient presents with   Chest Pain    Jason [name] is a 61 y.o. male.  61 y/o male with hx of HTN, HLD presents to the ED for evaluation of chest pain.  He reports that pain has been present since 2 AM and constant.  It woke him from sleep this morning.  He describes the pain as an "ache" that is nonradiating.  It has been aggravated when lying flat and slightly improved when upright.  He did have some improvement also with sublingual nitroglycerin.  Initially took 2 SL tablets with subsequent 2 tablets approximately an hour later.  He presently rates his pain at a 3/10.  Does note some lightheadedness, diaphoresis.  No shortness of breath, dyspnea on exertion, worsening pain with exertion, extremity numbness or paresthesias, leg swelling, abdominal pain, nausea, vomiting, recent fevers or cough.  Recent history of NSTEMI 1 month ago with PCI/DES to the mid RCA for 100% stenosis. Cardiologist - Dr. Dietrich PatesPaula Ross  The history is provided by the patient. No language interpreter was used.  Chest Pain  HPI: A 61 year old patient with a history of hypertension, hypercholesterolemia and obesity presents for evaluation of chest pain. Initial onset of pain was approximately 1-3 hours ago. The patient's chest pain is not worse with exertion. The patient's chest pain is middle- or left-sided, is not well-localized, is not described as heaviness/pressure/tightness, is not sharp and does not radiate to the arms/jaw/neck. The patient does not complain of nausea and denies diaphoresis. The patient has no history of stroke, has no history of peripheral artery disease, has not smoked in the past 90 days, denies any history of treated diabetes and has no relevant family history of coronary artery disease (first degree relative at less than age 59).   Home  Medications Prior to Admission medications   Medication Sig Start Date End Date Taking? Authorizing Provider  pantoprazole (PROTONIX) 20 MG tablet Take 1 tablet (20 mg total) by mouth daily. 10/09/21  Yes Antony MaduraHumes, Eliel Dudding, PA-C  aspirin EC 81 MG EC tablet Take 1 tablet (81 mg total) by mouth daily. Swallow whole. 09/05/21   [facility], Roe RutherfordAngela Nicole, PA  atorvastatin (LIPITOR) 80 MG tablet Take 1 tablet (80 mg total) by mouth daily. 09/05/21   [facility], Roe RutherfordAngela Nicole, PA  metoprolol tartrate (LOPRESSOR) 25 MG tablet Take 1 tablet (25 mg total) by mouth 2 (two) times daily. 09/04/21   [facility], Roe RutherfordAngela Nicole, PA  nitroGLYCERIN (NITROSTAT) 0.4 MG SL tablet Place 1 tablet (0.4 mg total) under the tongue every 5 (five) minutes x 3 doses as needed for chest pain. 09/04/21   [facility], Roe RutherfordAngela Nicole, PA  Omega-3 Fatty Acids (FISH OIL) 1200 MG CAPS Take 1,200 mg by mouth daily.    [provider]  polyethylene glycol (MIRALAX / GLYCOLAX) packet Take 17 g by mouth daily as needed for mild constipation. 04/02/16   Montez MoritaPaul, Keith, PA-C  ticagrelor (BRILINTA) 90 MG TABS tablet Take 1 tablet (90 mg total) by mouth 2 (two) times daily. 09/04/21   Marcelino Dusteruke, Angela Nicole, PA      Allergies    Lisinopril and No known allergies    Review of Systems   Review of Systems  Cardiovascular:  Positive for chest pain.  Ten systems reviewed and are negative for acute change, except as noted in the HPI.  Physical Exam Updated Vital Signs BP (!) 157/94    Pulse 71    Temp 97.7 F (36.5 C) (Oral)    Resp 11    Ht 6' (1.829 m)    Wt 102.1 kg    SpO2 99%    BMI 30.52 kg/m   Physical Exam Vitals and nursing note reviewed.  Constitutional:      General: He is not in acute distress.    Appearance: He is well-developed. He is not diaphoretic.     Comments: Nontoxic appearing and in NAD  HENT:     Head: Normocephalic and atraumatic.  Eyes:     General: No scleral icterus.    Conjunctiva/sclera: Conjunctivae normal.  Cardiovascular:     Rate  and Rhythm: Normal rate and regular rhythm.     Pulses: Normal pulses.  Pulmonary:     Effort: Pulmonary effort is normal. No respiratory distress.     Breath sounds: No stridor. No wheezing, rhonchi or rales.  Abdominal:     Comments: Abdomen mildly distended, obese and soft.  Musculoskeletal:        General: Normal range of motion.     Cervical back: Normal range of motion.     Comments: No BLE edema.  Skin:    General: Skin is warm and dry.     Coloration: Skin is not pale.     Findings: No erythema or rash.  Neurological:     Mental Status: He is alert and oriented to person, place, and time.  Psychiatric:        Behavior: Behavior normal.    ED Results / Procedures / Treatments   Labs (all labs ordered are listed, but only abnormal results are displayed) Labs Reviewed  BASIC METABOLIC PANEL - Abnormal; Notable for the following components:      Result Value   Glucose, Bld 133 (*)    Creatinine, Ser 1.26 (*)    All other components within normal limits  CBC  TROPONIN I (HIGH SENSITIVITY)  TROPONIN I (HIGH SENSITIVITY)    EKG EKG Interpretation  Date/Time:  Saturday October 09 2021 07:31:04 EST Ventricular Rate:  79 PR Interval:  165 QRS Duration: 107 QT Interval:  399 QTC Calculation: 458 R Axis:   -17 Text Interpretation: Sinus rhythm Borderline left axis deviation RSR' in V1 or V2, right VCD or RVH  new t waves since 2/4 in the inferior leads Confirmed by Jacalyn Lefevre [phone]) on 10/09/2021 8:03:13 AM  Radiology DG Chest 2 View  Result Date: 10/09/2021 CLINICAL DATA:  Acute chest pain EXAM: CHEST - 2 VIEW COMPARISON:  09/03/2021 and prior radiographs FINDINGS: The cardiomediastinal silhouette is unremarkable. Minimal LEFT basilar atelectasis/scarring is identified. There is no evidence of focal airspace disease, pulmonary edema, suspicious pulmonary nodule/mass, pleural effusion, or pneumothorax. No acute bony abnormalities are identified. IMPRESSION: Minimal  LEFT basilar atelectasis/scarring. Electronically Signed   By: Harmon Pier M.D.   On: 10/09/2021 09:06    Procedures Procedures    Medications Ordered in ED Medications  aspirin chewable tablet 243 mg (243 mg Oral Given 10/09/21 2355)    ED Course/ Medical Decision Making/ A&P Clinical Course as of 10/09/21 1141  Sat Oct 09, 2021  0805 Cardiac Catheterization 09/03/21:   Mid RCA lesion is 100% stenosed.   Prox Cx lesion is 30% stenosed.   1st Mrg lesion is 30% stenosed.   Mid LAD lesion is 40% stenosed.   Prox RCA lesion is 60% stenosed.   A  drug-eluting stent was successfully placed using a SYNERGY XD 4.0X28.   Post intervention, there is a 0% residual stenosis.   Acute MI secondary to thrombotic occlusion of the mid RCA Successful PTCA/DES x 1 mid RCA Mild Circumflex disease Moderate mid LAD  [KH]  0920 Heart score 5 c/w moderate risk. Consult placed to Cardiology to assist in disposition. [KH]  [phone] Spoke with Dr. Graciela Husbands of cardiology. Their service will assess patient in the ED to assist in disposition.  Phlebotomy at bedside obtaining delta troponin.  Patient reassessed and updated.  He reports that he is pain-free at this time. [KH]  1028 Repeat troponin is stable, normal. Pending cardiology recommendations. [KH]  1125 Spoke with Dr. Graciela Husbands of Cardiology who has assessed the patient in the ED. Patient cleared for discharge; advised restarting his Losartan/HCTZ.  [KH]    Clinical Course User Index [KH] Antony Madura, PA-C   HEAR Score: 3                       Medical Decision Making Amount and/or Complexity of Data Reviewed Labs: ordered. Radiology: ordered.  Risk OTC drugs. Prescription drug management.   This patient presents to the ED for concern of chest pain, this involves an extensive number of treatment options, and is a complaint that carries with it a high risk of complications and morbidity.  The differential diagnosis includes ACS vs costochondritis vs     Co morbidities that complicate the patient evaluation  CAD HTN HLD   Additional history obtained:  Additional history obtained from wife at bedside External records from outside source obtained and reviewed including prior troponin values and outpatient cardiology follow  up visit in February.   Lab Tests:  I Ordered, and personally interpreted labs.  The pertinent results include:  normal troponin x 2, mild creatinine elevation (stable, unchanged).   Imaging Studies ordered:  I ordered imaging studies including CXR  I independently visualized and interpreted imaging which showed no acute cardiopulmonary abnormality I agree with the radiologist interpretation   Cardiac Monitoring:  The patient was maintained on a cardiac monitor.  I personally viewed and interpreted the cardiac monitored which showed an underlying rhythm of: NSR   Medicines ordered and prescription drug management:  I ordered medication including ASA per chest pain protocol  Reevaluation of the patient after these medicines showed that the patient resolved I have reviewed the patients home medicines and have made adjustments as needed   Consultations Obtained:  I requested consultation with the cardiology service (MD Graciela Husbands),  and discussed lab and imaging findings as well as pertinent plan - they recommend: restarting Losartan/HCTZ, follow up with PCP for BMET in 2 weeks. Will initiate PPI.   Reevaluation:  After the interventions noted above, I reevaluated the patient and found that they have :improved   Social Determinants of Health:  Insured patient Established PCP care   Dispostion:  After consideration of the diagnostic results and the patients response to treatment, I feel that the patent would benefit from outpatient PCP and Cardiology follow up. Will restart Losartan/HCTZ and initiate PPI, per cardiology recommendations. Patient to follow with PCP for BMET in 2 weeks. Return  precautions discussed and provided. Patient discharged in stable condition with no unaddressed concerns.         Final Clinical Impression(s) / ED Diagnoses Final diagnoses:  Nonspecific chest pain    Rx / DC Orders ED Discharge Orders  Ordered    pantoprazole (PROTONIX) 20 MG tablet  Daily        10/09/21 1128              Antony Madura, [place] 10/09/21 1151    Jacalyn Lefevre, MD 10/09/21 1155

## 2021-10-09 NOTE — ED Triage Notes (Signed)
Pt states acute onset chest pain at 2 am.  Somewhat relieved by nitro.  Stent placement Feb 3rd. ?

## 2021-10-09 NOTE — ED Notes (Signed)
Waiing on pt to get out of RR and do vitals  ?

## 2021-10-09 NOTE — Consult Note (Addendum)
Cardiology Consultation:   Patient ID: Jason [name] MRN: [id number]; DOB: May 15, 1961  Admit date: 10/09/2021 Date of Consult: 10/09/2021  PCP:  Manon Hilding, MD   [facility] Providers Cardiologist:  Dorris Carnes, MD        Patient Profile:   Jason [name] is a 61 y.o. male with a hx of CAD (s/p NSTEMI in 09/2021 with DES to mid-RCA and residual proximal RCA stenosis, mild LCx and moderate LAD disease with medical management recommended), HTN, HLD and Stage 2-3 CKD who is being seen 10/09/2021 for the evaluation of chest pain at the request of [name], PA-C.  History of Present Illness:   Mr. [name] was recently admitted to [facility] from 2/3 - 09/04/2021 for an NSTEMI as he presented with worsening dyspnea on exertion for the past month and reported intermittent chest pain for 2 days prior to admission. His cardiac catheterization showed 100% stenosis of the mid RCA which was treated with DES placement. He did have residual 60% proximal RCA stenosis, 40% mid LAD stenosis, 30% first marginal stenosis and 30% proximal LCx stenosis with medical management recommended of his residual disease. Echocardiogram during admission showed an EF of 55 to 60% with akinesis of the basal inferior wall and hypokinesis of the basal inferior lateral wall. He did not have any significant valve abnormalities.  He did have an AKI during admission with creatinine peaking at 1.67 but improved to 1.57 at the time of discharge. He was discharged on ASA 81 mg daily, Atorvastatin 80 mg daily, Lopressor 25 mg twice daily and Brilinta 90 mg twice daily with PTA Olmesartan-HCTZ being held given his renal function.  He did follow-up with Coletta Memos, NP on 09/10/2021 and reported 1 episode of heartburn which occurred after consuming [employer] but denied any exertional chest pain. No changes were made to his medication regimen. His creatinine had improved to 1.39 by repeat labs.  He presented to [facility]  ED this morning for evaluation of chest pain since 0200. In talking with the patient and his wife today, he reports he has overall been feeling well since his recent admission. Has been walking for a mile a day and denies any anginal symptoms with this. Has been cutting wood the past two weekends with no anginal symptoms. He has lost 15 lbs due to his increased activity and dietary changes. During the early morning hours of 3/11, he was awoken from sleep due to a dull pain along his epigastric region. Says the pain felt different from his MI as he experienced radiating pain down his arms at that time and his pain was more intense. The dull pain did not radiate but he did take SL NTG x2 with improvement but not full resolution of his symptoms. Went back to sleep but had recurrent pain which improved with sitting up and took an additional 2 SL NTG and felt dizzy afterwards. No associated syncope. He does not think food contributed as he only ate half of his dinner last night and had hibachi chicken and vegetables. No spicy or acidic foods. His pain did not fully resolve and was a 4/10 upon arrival to the ED but has now fully subsided. He denies any recent palpitations, orthopnea, PND or pitting edema. Reports good compliance with his medications but has missed two doses of PM Brilinta since his MI.     Initial labs show WBC 7.8, Hgb 15.1, platelets 179, Na+ 140, K+ 3.9 and creatinine 1.26. Initial Hs troponin negative with  repeat pending (peaked at [phone] during recent admission). CXR showing minimal left basilar atelectasis. EKG shows NSR, HR 79 with TWI along the inferior leads which is similar to prior tracings.   Past Medical History:  Diagnosis Date   Arthritis    CAD (coronary artery disease)    a. s/p NSTEMI in 09/2021 with DES to mid-RCA and residual proximal RCA stenosis, mild LCx and moderate LAD disease with medical management recommended   Headache    migraines    last one "been awhile" 6 mths or  so   HTN (hypertension)    Hyperlipidemia     Past Surgical History:  Procedure Laterality Date   CORONARY/GRAFT ACUTE MI REVASCULARIZATION N/A 09/03/2021   Procedure: CORONARY/GRAFT ACUTE MI REVASCULARIZATION;  Surgeon: Burnell Blanks, MD;  Location: [place] [facility];  Service: Cardiovascular;  Laterality: N/A;   KNEE ARTHROSCOPY Left    3 on left and 1 on the right   LEFT HEART CATH AND CORONARY ANGIOGRAPHY N/A 09/03/2021   Procedure: LEFT HEART CATH AND CORONARY ANGIOGRAPHY;  Surgeon: Burnell Blanks, MD;  Location: [place] [facility];  Service: Cardiovascular;  Laterality: N/A;   TONSILLECTOMY     TOTAL KNEE ARTHROPLASTY Left 04/01/2016   Procedure: TOTAL KNEE ARTHROPLASTY;  Surgeon: Earlie Server, MD;  Location: [place];  Service: Orthopedics;  Laterality: Left;     Home Medications:  Prior to Admission medications   Medication Sig Start Date End Date Taking? Authorizing Provider  aspirin EC 81 MG EC tablet Take 1 tablet (81 mg total) by mouth daily. Swallow whole. 09/05/21   [facility], Tami Lin, PA  atorvastatin (LIPITOR) 80 MG tablet Take 1 tablet (80 mg total) by mouth daily. 09/05/21   [facility], Tami Lin, PA  metoprolol tartrate (LOPRESSOR) 25 MG tablet Take 1 tablet (25 mg total) by mouth 2 (two) times daily. 09/04/21   [facility], Tami Lin, PA  nitroGLYCERIN (NITROSTAT) 0.4 MG SL tablet Place 1 tablet (0.4 mg total) under the tongue every 5 (five) minutes x 3 doses as needed for chest pain. 09/04/21   [facility], Tami Lin, PA  Omega-3 Fatty Acids (FISH OIL) 1200 MG CAPS Take 1,200 mg by mouth daily.    [provider]  polyethylene glycol (MIRALAX / GLYCOLAX) packet Take 17 g by mouth daily as needed for mild constipation. 04/02/16   Ainsley Spinner, PA-C  ticagrelor (BRILINTA) 90 MG TABS tablet Take 1 tablet (90 mg total) by mouth 2 (two) times daily. 09/04/21   Ledora Bottcher, PA    Inpatient Medications: Scheduled Meds:  Continuous Infusions:  PRN  Meds:   Allergies:    Allergies  Allergen Reactions   Lisinopril    No Known Allergies     Social History:   Social History   Socioeconomic History   Marital status: Unknown    Spouse name: Not on file   Number of children: Not on file   Years of education: Not on file   Highest education level: Not on file  Occupational History   Not on file  Tobacco Use   Smoking status: Never   Smokeless tobacco: Never  Substance and Sexual Activity   Alcohol use: Yes    Alcohol/week: 10.0 standard drinks    Types: 10 Cans of beer per week   Drug use: No   Sexual activity: Not on file  Other Topics Concern   Not on file  Social History Narrative   Not on file   Social Determinants  of Health   Financial Resource Strain: Not on file  Food Insecurity: Not on file  Transportation Needs: Not on file  Physical Activity: Not on file  Stress: Not on file  Social Connections: Not on file  Intimate Partner Violence: Not on file    Family History:   History reviewed. No pertinent family history.    ROS:  Please see the history of present illness.   All other ROS reviewed and negative.     Physical Exam/Data:   Vitals:   10/09/21 0930 10/09/21 1000 10/09/21 1030 10/09/21 1100  BP: (!) 139/94 (!) 144/82 (!) 133/96 (!) 157/94  Pulse: (!) 49 (!) 55 (!) 58 71  Resp: (!) 8 10 13 11   Temp:      TempSrc:      SpO2: 98% 99% 98% 99%  Weight:      Height:       No intake or output data in the 24 hours ending 10/09/21 1127 Last 3 Weights 10/09/2021 09/10/2021 09/03/2021  Weight (lbs) 225 lb 240 lb 6.4 oz 235 lb  Weight (kg) 102.059 kg 109.045 kg 106.595 kg     Body mass index is 30.52 kg/m.  General: Pleasant male appearing in no acute distress. HEENT: normal Neck: no JVD Vascular: No carotid bruits; Distal pulses 2+ bilaterally Cardiac:  normal S1, S2; RRR; no murmur Lungs:  clear to auscultation bilaterally, no wheezing, rhonchi or rales  Abd: soft, nontender, no hepatomegaly   Ext: no pitting edema Musculoskeletal:  No deformities, BUE and BLE strength normal and equal Skin: warm and dry  Neuro:  CNs 2-12 intact, no focal abnormalities noted Psych:  Normal affect   EKG:  The EKG was personally reviewed and demonstrates: NSR, HR 79 with TWI along the inferior leads which is similar to prior tracings.  Telemetry:  Telemetry was personally reviewed and demonstrates: NSR, HR in 50's to 60's.   Relevant CV Studies:  Echocardiogram: 09/2021 IMPRESSIONS     1. Akinesis of the basal inferior wall, hypokinesis of the basal  inferolataral wall. . Left ventricular ejection fraction, by estimation,  is 55 to 60%. The left ventricle has normal function. The left ventricle  demonstrates regional wall motion  abnormalities (see scoring diagram/findings for description). Left  ventricular diastolic parameters were normal.   2. Right ventricular systolic function is normal. The right ventricular  size is normal.   3. The mitral valve is normal in structure. Trivial mitral valve  regurgitation.   4. The aortic valve is normal in structure. Aortic valve regurgitation is  not visualized.   LHC: 09/2021   Mid RCA lesion is 100% stenosed.   Prox Cx lesion is 30% stenosed.   1st Mrg lesion is 30% stenosed.   Mid LAD lesion is 40% stenosed.   Prox RCA lesion is 60% stenosed.   A drug-eluting stent was successfully placed using a SYNERGY XD 4.0X28.   Post intervention, there is a 0% residual stenosis.   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 Will start a beta blocker. Continue high intensity statin. Fast track discharge tomorrow if stable. Resume home ARB tomorrow.   Laboratory Data:  High Sensitivity Troponin:   Recent Labs  Lab 10/09/21 0735 10/09/21 0946  TROPONINIHS 8 8      Chemistry Recent Labs  Lab 10/09/21 0735  NA  140  K 3.9  CL 105  CO2 26  GLUCOSE 133*  BUN 14  CREATININE 1.26*  CALCIUM 9.6  GFRNONAA >60  ANIONGAP 9    No results for input(s): PROT, ALBUMIN, AST, ALT, ALKPHOS, BILITOT in the last 168 hours. Lipids No results for input(s): CHOL, TRIG, HDL, LABVLDL, LDLCALC, CHOLHDL in the last 168 hours.  Hematology Recent Labs  Lab 10/09/21 0735  WBC 7.8  RBC 5.14  HGB 15.1  HCT 43.7  MCV 85.0  MCH 29.4  MCHC 34.6  RDW 12.5  PLT 179   Thyroid No results for input(s): TSH, FREET4 in the last 168 hours.  BNPNo results for input(s): BNP, PROBNP in the last 168 hours.  DDimer No results for input(s): DDIMER in the last 168 hours.   Radiology/Studies:  DG Chest 2 View  Result Date: 10/09/2021 CLINICAL DATA:  Acute chest pain EXAM: CHEST - 2 VIEW COMPARISON:  09/03/2021 and prior radiographs FINDINGS: The cardiomediastinal silhouette is unremarkable. Minimal LEFT basilar atelectasis/scarring is identified. There is no evidence of focal airspace disease, pulmonary edema, suspicious pulmonary nodule/mass, pleural effusion, or pneumothorax. No acute bony abnormalities are identified. IMPRESSION: Minimal LEFT basilar atelectasis/scarring. Electronically Signed   By: Margarette Canada M.D.   On: 10/09/2021 09:06     Assessment and Plan:   1. Chest Pain with Atypical Features - His chest pain overall seems atypical for angina and does not resemble his prior pain last month. Hs Troponin values have been negative thus far and his EKG is without acute ST changes. Symptoms are possibly secondary to acid reflux and can consider a trial of PPI therapy. Dressler Syndrome less likely as no friction rub appreciated on exam and his pain has now resolved. Discussed with Dr. Caryl Comes in regards to checking ESR and CRP and given it will likely be non-specific, would not check at this time. Will restart his prior Olmesartan-HCTZ and plan for follow-up labs in 7-10  days.   2. CAD - He is s/p NSTEMI in 09/2021 with DES to mid-RCA and residual proximal RCA stenosis, mild LCx and moderate LAD disease with medical management recommended of his residual disease. - Hs Troponin values have been negative thus far. EKG without acute ST changes when compared to prior tracings.  - Continue ASA 33m daily, Brilinta 940mBID, Atorvastatin 8038maily and Lopressor 13m70mD. Compliance with BID dosing of Brilinta encouraged as he has missed a few doses since stent placement since he is not accustomed to taking PM medications.  3. HTN - BP has been elevated while in the ED. Continue Lopressor 13mg68m. Could add back Olmesartan if needed now that his renal function has improved.    4. HLD - LDL was at 161 during his recent admission and he was started on Atorvastatin 80mg 59my. There are plans for repeat labs as an outpatient in several months to make sure his LDL is at goal.   5. Stage 2-3 CKD - His creatinine peaked at 1.67 during his admission last month, improved to 1.39 on 09/07/2021 and at 1.26 today.    Risk Assessment/Risk Scores:     HEAR Score (for undifferentiated chest pain):  HEAR Score: 3  For questions or updates, please contact [facility] consult [email or web address] for contact info under    Signed, BrittaErma Heritage  10/09/2021 11:27 AM   Chest pain atypical and typical features 2 months post PCI for NSTEMI  HTN  CKD peak 1.67>> 1.26  Pts pain is atypical and distinct from MI pain ( and pre MI discomfort), and having lasted >6 hrs, to be assoc with neg hsTn makes this almost certainly  non cardiac.  We will let him go home and have suggested a OTC PPI   His BP is very elevated here ( 170/111) so will have him resume his olmesartan/HCt 40/12.5 and have him check his BMET and K in about 2 weeks with his PCP; renal function is much better    Followup is scheduled with Dr Ysidro Evert n 5/23

## 2021-10-09 NOTE — Discharge Instructions (Addendum)
Your work-up in the emergency department was reassuring.  Cardiology recommends that you restart your losartan/HCTZ.  Follow-up with your primary care doctor for blood pressure recheck within the week. You should also have a repeat BMP completed in 2 weeks; if this cannot be coordinated through your primary office, call cardiology to schedule an outpatient lab draw.  Start Protonix daily, as prescribed.  Return for new or concerning symptoms. ?

## 2021-10-22 ENCOUNTER — Other Ambulatory Visit (HOSPITAL_COMMUNITY)
Admission: RE | Admit: 2021-10-22 | Discharge: 2021-10-22 | Disposition: A | Discharge status: Home / Self Care | Payer: BC Managed Care – PPO | Source: Ambulatory Visit | Attending: Student | Admitting: Student

## 2021-10-22 DIAGNOSIS — Z79899 Other long term (current) drug therapy: Secondary | ICD-10-CM | POA: Insufficient documentation

## 2021-10-22 LAB — BASIC METABOLIC PANEL
Anion gap: 9 (ref 5–15)
BUN: 18 mg/dL (ref 6–20)
CO2: 27 mmol/L (ref 22–32)
Calcium: 9.4 mg/dL (ref 8.9–10.3)
Chloride: 102 mmol/L (ref 98–111)
Creatinine, Ser: 1.31 mg/dL — ABNORMAL HIGH (ref 0.61–1.24)
GFR, Estimated: >60 mL/min (ref >60)
Glucose, Bld: 98 mg/dL (ref 70–99)
Potassium: 3.6 mmol/L (ref 3.5–5.1)
Sodium: 138 mmol/L (ref 135–145)

## 2021-12-24 DIAGNOSIS — I10 Essential (primary) hypertension: Secondary | ICD-10-CM | POA: Diagnosis not present

## 2021-12-24 DIAGNOSIS — N183 Chronic kidney disease, stage 3 unspecified: Secondary | ICD-10-CM | POA: Diagnosis not present

## 2021-12-24 DIAGNOSIS — E7800 Pure hypercholesterolemia, unspecified: Secondary | ICD-10-CM | POA: Diagnosis not present

## 2021-12-28 NOTE — Progress Notes (Unsigned)
Cardiology Office Note   Date:  12/29/2021   ID:  Jason [name], DOB Nov 04, 1960, MRN [id number]  PCP:  Estanislado Pandy, MD  Cardiologist:   Dietrich Pates, MD   Pt presents for follow up of CAD     History of Present Illness: Jason [name] is a 61 y.o. male with a history of HTN, HL.   He presented to ER on 09/03/21 with CP   Intermittent.  Night before had severe pain lasting hours   Went to ED   LHC showed 100% mid RCA lesion and patient underwent PTCA/DES.   There was mild dz  noted in LAD and LCx    Echo prior to d/c showed LVEF normal  with akinesis of the baseal inferior, inferolateral walls The pt went to the ER again on March 11   Complained of CP  BP very high   Trop negative    Seen by cardiology.   BP meds restarted.   Pt sent home      Since then he has had no further pain   Breathing is OK  He denies dizziness. He has started changing his diet    Stopped chips   Wt is down     Diet:   Breakfast:   Yogurt Yoplait Banana   Bottle water Lunch:   Whatever      [employer] fast streamer, meat, veggies   Drinks  Water, gatorade sweet tea and beer (2 per day)  Current Meds  Medication Sig   aspirin EC 81 MG EC tablet Take 1 tablet (81 mg total) by mouth daily. Swallow whole.   atorvastatin (LIPITOR) 80 MG tablet Take 1 tablet (80 mg total) by mouth daily.   metoprolol tartrate (LOPRESSOR) 25 MG tablet Take 1 tablet (25 mg total) by mouth 2 (two) times daily.   nitroGLYCERIN (NITROSTAT) 0.4 MG SL tablet Place 1 tablet (0.4 mg total) under the tongue every 5 (five) minutes x 3 doses as needed for chest pain.   olmesartan-hydrochlorothiazide (BENICAR HCT) 40-25 MG tablet Take 1 tablet by mouth daily.   Omega-3 Fatty Acids (FISH OIL) 1200 MG CAPS Take 1,200 mg by mouth daily.   pantoprazole (PROTONIX) 20 MG tablet Take 1 tablet (20 mg total) by mouth daily.   polyethylene glycol (MIRALAX / GLYCOLAX) packet Take 17 g by mouth daily as needed for mild constipation.   ticagrelor  (BRILINTA) 90 MG TABS tablet Take 1 tablet (90 mg total) by mouth 2 (two) times daily.     Allergies:   Lisinopril and No known allergies   Past Medical History:  Diagnosis Date   Arthritis    CAD (coronary artery disease)    a. s/p NSTEMI in 09/2021 with DES to mid-RCA and residual proximal RCA stenosis, mild LCx and moderate LAD disease with medical management recommended   Headache    migraines    last one "been awhile" 6 mths or so   HTN (hypertension)    Hyperlipidemia     Past Surgical History:  Procedure Laterality Date   CORONARY/GRAFT ACUTE MI REVASCULARIZATION N/A 09/03/2021   Procedure: CORONARY/GRAFT ACUTE MI REVASCULARIZATION;  Surgeon: Kathleene Hazel, MD;  [facility];  Service: Cardiovascular;  Laterality: N/A;   KNEE ARTHROSCOPY Left    3 on left and 1 on the right   LEFT HEART CATH AND CORONARY ANGIOGRAPHY N/A 09/03/2021   Procedure: LEFT HEART CATH AND CORONARY ANGIOGRAPHY;  Surgeon: Kathleene Hazel, MD;  Location: [facility];  Service: Cardiovascular;  Laterality: N/A;   TONSILLECTOMY     TOTAL KNEE ARTHROPLASTY Left 04/01/2016   Procedure: TOTAL KNEE ARTHROPLASTY;  Surgeon: Frederico Hamman, MD;  Location: [facility];  Service: Orthopedics;  Laterality: Left;     Social History:  The patient  reports that he has never smoked. He has never used smokeless tobacco. He reports current alcohol use of about 10.0 standard drinks per week. He reports that he does not use drugs.   Family History:  The patient's family history is not on file.    ROS:  Please see the history of present illness. All other systems are reviewed and  Negative to the above problem except as noted.    PHYSICAL EXAM: VS:  BP 100/74   Pulse 66   Ht 6' (1.829 m)   Wt 232 lb 6.4 oz (105.4 kg)   SpO2 98%   BMI 31.52 kg/m   GEN: Obese 61 yo  in no acute distress  HEENT: normal  Neck: no JVD, carotid bruits Cardiac: RRR; no murmurs  No LE edema  Respiratory:   clear to auscultation bilaterally GI: soft, nontender, nondistended, + BS  No hepatomegaly  MS: no deformity Moving all extremities   Skin: warm and dry, no rash Neuro:  Strength and sensation are intact Psych: euthymic mood, full affect   EKG:  EKG is not ordered today.  CARDIAC studies  Left heart cath 09/03/21:   Mid RCA lesion is 100% stenosed.   Prox Cx lesion is 30% stenosed.   1st Mrg lesion is 30% stenosed.   Mid LAD lesion is 40% stenosed.   Prox RCA lesion is 60% stenosed.   A drug-eluting stent was successfully placed using a SYNERGY XD 4.0X28.   Post intervention, there is a 0% residual stenosis.   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 Will start a beta blocker. Continue high intensity statin. Fast track discharge tomorrow if stable. Resume home ARB tomorrow.    ____________     Echo 09/03/21:  1. Akinesis of the basal inferior wall, hypokinesis of the basal  inferolataral wall. . Left ventricular ejection fraction, by estimation,  is 55 to 60%. The left ventricle has normal function. The left ventricle  demonstrates regional wall motion  abnormalities (see scoring diagram/findings for description). Left  ventricular diastolic parameters were normal.   2. Right ventricular systolic function is normal. The right ventricular  size is normal.   3. The mitral valve is normal in structure. Trivial mitral valve  regurgitation.   4. The aortic valve is normal in structure. Aortic valve regurgitation is  not visualized.     Lipid Panel    Component Value Date/Time   CHOL 225 (H) 09/03/2021 1340   TRIG 177 (H) 09/03/2021 1340   HDL 29 (L) 09/03/2021 1340   CHOLHDL 7.8 09/03/2021 1340   VLDL 35 09/03/2021 1340   LDLCALC 161 (H) 09/03/2021 1340      Wt Readings from Last 3 Encounters:   12/29/21 232 lb 6.4 oz (105.4 kg)  10/09/21 225 lb (102.1 kg)  09/10/21 240 lb 6.4 oz (109 kg)      ASSESSMENT AND PLAN:  1  CAD   Pt with recent NSTEMI   s/p PTCA/DES to RCA   Keep on ASA and Brilinta.  Continue statin   2  HTN  BP is under excellent control   At home BP in 120s/     He denies dizziness    With weight decreasing may be able to decrease       3  HL   On lipitor 80 mg   Will get labs done recently at [facility]  4  Diet    Congratulated on improvement   Cut out SSB    Try TRE       F/U in 6 months       Current medicines are reviewed at length with the patient today.  The patient does not have concerns regarding medicines.  Signed, Dietrich PatesPaula Mazy Culton, MD  12/29/2021 8:26 PM    [facility] [address], [place], [place]  [address] Phone: [phone]; Fax: [phone]

## 2021-12-29 ENCOUNTER — Encounter: Payer: Self-pay | Admitting: Internal Medicine

## 2021-12-29 ENCOUNTER — Ambulatory Visit (INDEPENDENT_AMBULATORY_CARE_PROVIDER_SITE_OTHER): Payer: BC Managed Care – PPO | Admitting: Internal Medicine

## 2021-12-29 VITALS — BP 100/74 | HR 66 | Ht 72.0 in | Wt 232.4 lb

## 2021-12-29 DIAGNOSIS — I2510 Atherosclerotic heart disease of native coronary artery without angina pectoris: Secondary | ICD-10-CM | POA: Diagnosis not present

## 2021-12-29 NOTE — Patient Instructions (Signed)
Medication Instructions:  ? ?*If you need a refill on your cardiac medications before your next appointment, please call your pharmacy* ? ? ?Lab Work: ? ?If you have labs (blood work) drawn today and your tests are completely normal, you will receive your results only by: ?MyChart Message (if you have MyChart) OR ?A paper copy in the mail ?If you have any lab test that is abnormal or we need to change your treatment, we will call you to review the results. ? ? ?Testing/Procedures: ? ? ? ?Follow-Up: ?At [facility], you and your health needs are our priority.  As part of our continuing mission to provide you with exceptional heart care, we have created designated Provider Care Teams.  These Care Teams include your primary Cardiologist (physician) and Advanced Practice Providers (APPs -  Physician Assistants and Nurse Practitioners) who all work together to provide you with the care you need, when you need it. ? ?We recommend signing up for the patient portal called "MyChart".  Sign up information is provided on this After Visit Summary.  MyChart is used to connect with patients for Virtual Visits (Telemedicine).  Patients are able to view lab/test results, encounter notes, upcoming appointments, etc.  Non-urgent messages can be sent to your provider as well.   ?To learn more about what you can do with MyChart, go to [email or web address].   ? ?Your next appointment:   ?6 month(s) ? ?The format for your next appointment:   ?In Person ? ?Provider:   ?Paula Ross, MD   ? ? ?Other Instructions ? ? ?Important Information About Sugar ? ? ? ? ?  ?

## 2022-05-26 IMAGING — DX DG CHEST 1V PORT
1 series · 1 of 1 positions shown · non-contrast
Comparison: Chest x-ray dated March 21, 2016.

CLINICAL DATA: Chest pain.

EXAM:
PORTABLE CHEST 1 VIEW

[chest ap]
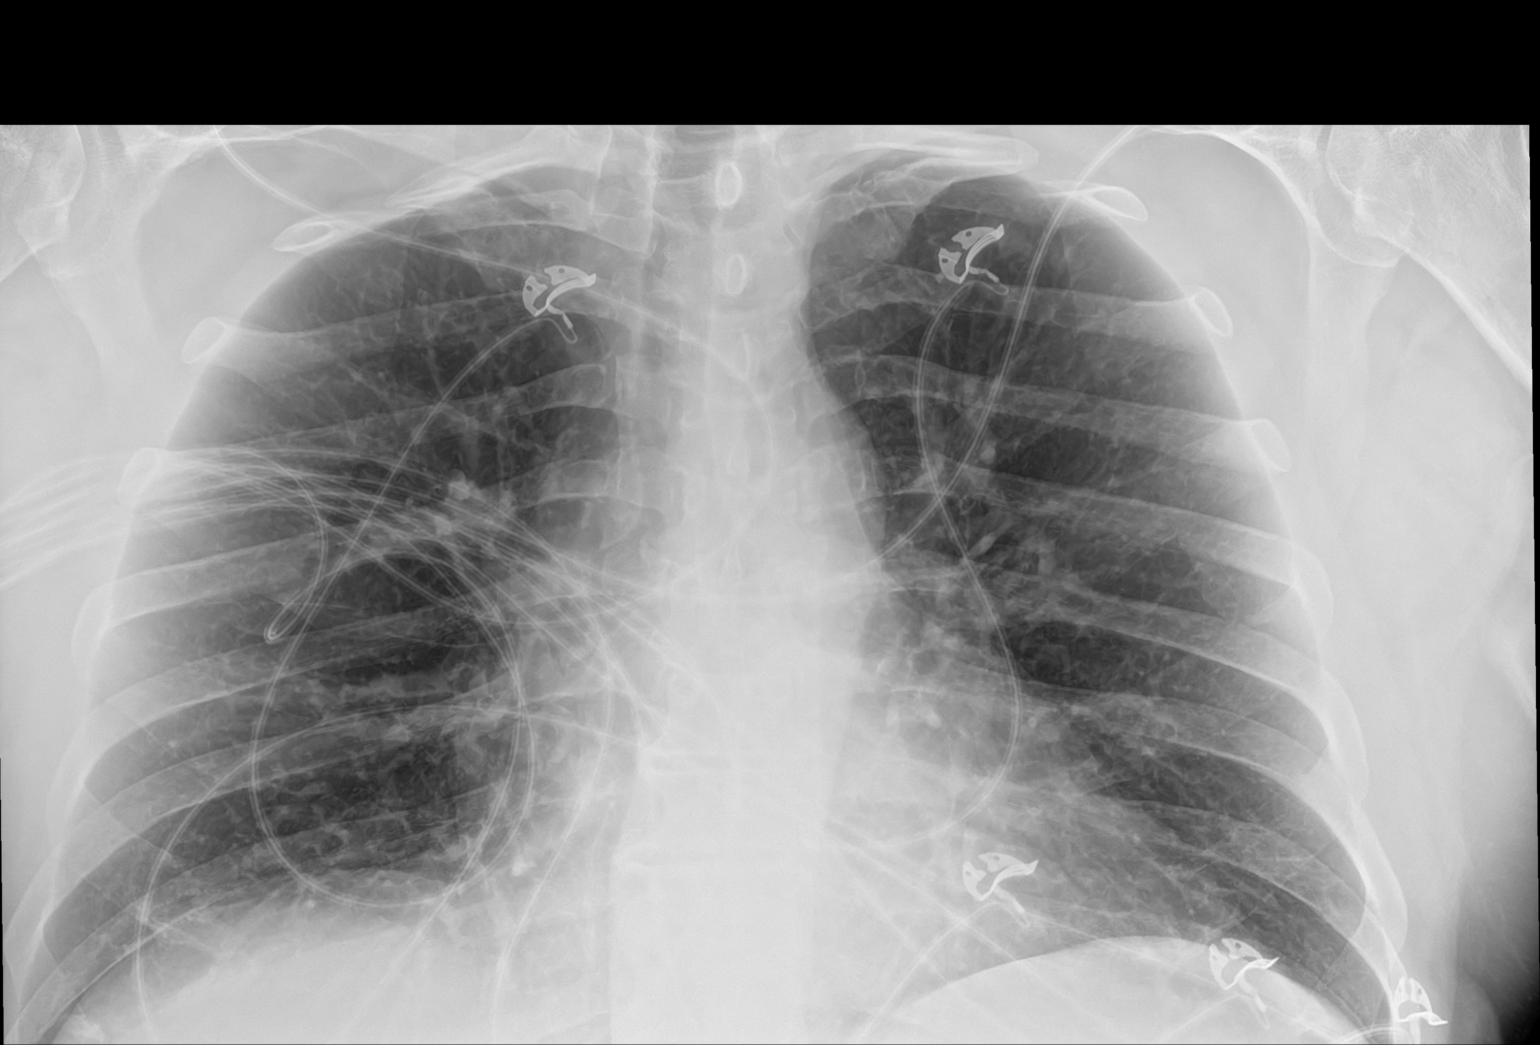

[1 of 1 positions shown; findings below may reference images not displayed]

FINDINGS: The heart size and mediastinal contours are within normal limits.
Both lungs are clear. The visualized skeletal structures are
unremarkable.
IMPRESSION: No active disease.

## 2022-06-20 ENCOUNTER — Other Ambulatory Visit: Payer: Self-pay | Admitting: *Deleted

## 2022-06-20 DIAGNOSIS — K8021 Calculus of gallbladder without cholecystitis with obstruction: Secondary | ICD-10-CM

## 2022-06-28 ENCOUNTER — Encounter: Payer: Self-pay | Admitting: Surgery

## 2022-06-28 ENCOUNTER — Ambulatory Visit (INDEPENDENT_AMBULATORY_CARE_PROVIDER_SITE_OTHER): Payer: BC Managed Care – PPO | Admitting: Surgery

## 2022-06-28 ENCOUNTER — Encounter: Payer: Self-pay | Admitting: *Deleted

## 2022-06-28 VITALS — BP 190/101 | HR 63 | Temp 98.0°F | Resp 14 | Ht 72.0 in | Wt 233.0 lb

## 2022-06-28 DIAGNOSIS — K8020 Calculus of gallbladder without cholecystitis without obstruction: Secondary | ICD-10-CM | POA: Diagnosis not present

## 2022-06-28 NOTE — Progress Notes (Unsigned)
Cardiology Office Note   Date:  06/30/2022   ID:  Jason [name], DOB Aug 01, 1961, MRN [id number]  PCP:  Estanislado Pandy, MD  Cardiologist:   Dietrich Pates, MD   Pt presents for follow up of CAD     History of Present Illness: Jason [name] is a 61 y.o. male with a history of HTN, HL.   He presented to ER on 09/03/21 with CP   Intermittent.  Night before had severe pain lasting hours   Went to ED   LHC showed 100% mid RCA lesion and patient underwent PTCA/DES.   There was mild dz  noted in LAD and LCx    Echo prior to d/c showed LVEF normal  with akinesis of the baseal inferior, inferolateral walls The pt went to the ER again on March 11   Complained of CP  BP very high   Trop negative    Seen by cardiology.   BP meds restarted.    I saw he pt in clinic in May 2023   The pt follows with Dr Neita Carp   BP low this summer  Taken off of HCTZ    Now BP has been high  The pt had labs done for annual exam   LFTs noted to be high  Abdominal USN done   Showed cholelithiasis  Not cystitits     The patient says if he lays on his R side he can feel it   Not at other times  NO fevers   No pain after eating      He denies CP  Breathing is good   Diet:   Working on cufting out carbs    Current Meds  Medication Sig   aspirin EC 81 MG EC tablet Take 1 tablet (81 mg total) by mouth daily. Swallow whole.   atorvastatin (LIPITOR) 80 MG tablet Take 1 tablet (80 mg total) by mouth daily.   BENICAR 40 MG tablet Take 40 mg by mouth daily.   metoprolol tartrate (LOPRESSOR) 25 MG tablet Take 1 tablet (25 mg total) by mouth 2 (two) times daily.   nitroGLYCERIN (NITROSTAT) 0.4 MG SL tablet Place 1 tablet (0.4 mg total) under the tongue every 5 (five) minutes x 3 doses as needed for chest pain.   Omega-3 Fatty Acids (FISH OIL) 1200 MG CAPS Take 1,200 mg by mouth daily.   ticagrelor (BRILINTA) 90 MG TABS tablet Take 1 tablet (90 mg total) by mouth 2 (two) times daily.     Allergies:   Lisinopril and No known  allergies   Past Medical History:  Diagnosis Date   Arthritis    CAD (coronary artery disease)    a. s/p NSTEMI in 09/2021 with DES to mid-RCA and residual proximal RCA stenosis, mild LCx and moderate LAD disease with medical management recommended   Headache    migraines    last one "been awhile" 6 mths or so   HTN (hypertension)    Hyperlipidemia     Past Surgical History:  Procedure Laterality Date   CORONARY/GRAFT ACUTE MI REVASCULARIZATION N/A 09/03/2021   Procedure: CORONARY/GRAFT ACUTE MI REVASCULARIZATION;  Surgeon: Kathleene Hazel, MD;  Location: [facility];  Service: Cardiovascular;  Laterality: N/A;   KNEE ARTHROSCOPY Left    3 on left and 1 on the right   LEFT HEART CATH AND CORONARY ANGIOGRAPHY N/A 09/03/2021   Procedure: LEFT HEART CATH AND CORONARY ANGIOGRAPHY;  Surgeon: Kathleene Hazel, MD;  Location: [facility] INVASIVE  CV LAB;  Service: Cardiovascular;  Laterality: N/A;   TONSILLECTOMY     TOTAL KNEE ARTHROPLASTY Left 04/01/2016   Procedure: TOTAL KNEE ARTHROPLASTY;  Surgeon: Frederico Hamman, MD;  Location: [facility] OR;  Service: Orthopedics;  Laterality: Left;     Social History:  The patient  reports that he has never smoked. He has never used smokeless tobacco. He reports current alcohol use of about 10.0 standard drinks of alcohol per week. He reports that he does not use drugs.   Family History:  The patient's family history is not on file.    ROS:  Please see the history of present illness. All other systems are reviewed and  Negative to the above problem except as noted.    PHYSICAL EXAM: VS:  BP (!) 154/102   Pulse (!) 53   Ht 6' (1.829 m)   Wt 236 lb (107 kg)   SpO2 96%   BMI 32.01 kg/m   GEN: Obese 61 yo  in no acute distress  HEENT: normal  Neck: no JVD, carotid bruits Cardiac: RRR; no murmurs  No LE edema  Respiratory:  clear to auscultation bilaterally GI: soft, nontender, nondistended, + BS  No RUQ tenderness MS: no deformity Moving  all extremities   Skin: warm and dry, no rash Neuro:  Strength and sensation are intact Psych: euthymic mood, full affect   EKG:  EKG is  ordered today.  Sinus bradycardia  53 bpm   LVH  CARDIAC studies  Left heart cath 09/03/21:   Mid RCA lesion is 100% stenosed.   Prox Cx lesion is 30% stenosed.   1st Mrg lesion is 30% stenosed.   Mid LAD lesion is 40% stenosed.   Prox RCA lesion is 60% stenosed.   A drug-eluting stent was successfully placed using a SYNERGY XD 4.0X28.   Post intervention, there is a 0% residual stenosis.   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 Will start a beta blocker. Continue high intensity statin. Fast track discharge tomorrow if stable. Resume home ARB tomorrow.    ____________     Echo 09/03/21:  1. Akinesis of the basal inferior wall, hypokinesis of the basal  inferolataral wall. . Left ventricular ejection fraction, by estimation,  is 55 to 60%. The left ventricle has normal function. The left ventricle  demonstrates regional wall motion  abnormalities (see scoring diagram/findings for description). Left  ventricular diastolic parameters were normal.   2. Right ventricular systolic function is normal. The right ventricular  size is normal.   3. The mitral valve is normal in structure. Trivial mitral valve  regurgitation.   4. The aortic valve is normal in structure. Aortic valve regurgitation is  not visualized.     Lipid Panel    Component Value Date/Time   CHOL 225 (H) 09/03/2021 1340   TRIG 177 (H) 09/03/2021 1340   HDL 29 (L) 09/03/2021 1340   CHOLHDL 7.8 09/03/2021 1340   VLDL 35 09/03/2021 1340   LDLCALC 161 (H) 09/03/2021 1340      Wt Readings from Last 3 Encounters:  06/30/22 236 lb (107 kg)  06/28/22 233 lb (105.7 kg)  12/29/21 232 lb 6.4 oz (105.4 kg)       ASSESSMENT AND PLAN:  1  CAD   Pt with NSTEMI in March 2023  He is  s/p PTCA/DES to RCA   Keep on ASA and Brilinta.     Continue statin   2 GI   REcent labs showed elevated LFTs  AST/ALT 81 and 160   Bili 1.7    USN showed gallstones  Being evaulated for choley  He is still less than 1 year from stent being implanted   will review with interventional service  Repeat LFTs today   2  HTN  BP is a little high   HCTZ was stopped   Will review clinic note from the summer   Need better control  Check labs    3  HL   On lipitor 80 mg    LDL in Sepember was 45    HDL 30     4  CKD   Cr in September was 1.84   Follow  check today    4  Diet   Keep working on Carbs        F/U in 6 months       Current medicines are reviewed at length with the patient today.  The patient does not have concerns regarding medicines.  Signed, Dietrich Pates, MD  06/30/2022 5:03 PM    [facility] [address] [place], [place], [place]  [address] Phone: [phone]; Fax: [phone]

## 2022-06-28 NOTE — Progress Notes (Signed)
[facility] History and Physical  Reason for Referral:*** Referring Physician: ***  Chief Complaint   New Patient (Initial Visit)     Jason [name] is a 61 y.o. male.  HPI: ***.  The *** started *** and has had a duration of ***.  It is associated with ***.  The *** is improved with ***, and is made worse with ***.    Quality*** Context***  Past Medical History:  Diagnosis Date  . Arthritis   . CAD (coronary artery disease)    a. s/p NSTEMI in 09/2021 with DES to mid-RCA and residual proximal RCA stenosis, mild LCx and moderate LAD disease with medical management recommended  . Headache    migraines    last one "been awhile" 6 mths or so  . HTN (hypertension)   . Hyperlipidemia     Past Surgical History:  Procedure Laterality Date  . CORONARY/GRAFT ACUTE MI REVASCULARIZATION N/A 09/03/2021   Procedure: CORONARY/GRAFT ACUTE MI REVASCULARIZATION;  Surgeon: Kathleene Hazel, MD;  Location: [facility];  Service: Cardiovascular;  Laterality: N/A;  . KNEE ARTHROSCOPY Left    3 on left and 1 on the right  . LEFT HEART CATH AND CORONARY ANGIOGRAPHY N/A 09/03/2021   Procedure: LEFT HEART CATH AND CORONARY ANGIOGRAPHY;  Surgeon: Kathleene Hazel, MD;  Location: [facility];  Service: Cardiovascular;  Laterality: N/A;  . TONSILLECTOMY    . TOTAL KNEE ARTHROPLASTY Left 04/01/2016   Procedure: TOTAL KNEE ARTHROPLASTY;  Surgeon: Frederico Hamman, MD;  Location: [facility] OR;  Service: Orthopedics;  Laterality: Left;    No family history on file.  Social History   Tobacco Use  . Smoking status: Never  . Smokeless tobacco: Never  Substance Use Topics  . Alcohol use: Yes    Alcohol/week: 10.0 standard drinks of alcohol    Types: 10 Cans of beer per week  . Drug use: No    Medications: {medication reviewed/display:3041432} Allergies as of 06/28/2022       Reactions   Lisinopril    No Known Allergies         Medication List         Accurate as of June 28, 2022  3:24 PM. If you have any questions, ask your nurse or doctor.          STOP taking these medications    olmesartan-hydrochlorothiazide 40-25 MG tablet Commonly known as: BENICAR HCT Stopped by: Curvin Hunger A Genise Strack, DO   pantoprazole 20 MG tablet Commonly known as: PROTONIX Stopped by: Kaiea Esselman A Cannie Muckle, DO   polyethylene glycol 17 g packet Commonly known as: MIRALAX / GLYCOLAX Stopped by: Vedder Brittian A Evah Rashid, DO       TAKE these medications    aspirin EC 81 MG tablet Take 1 tablet (81 mg total) by mouth daily. Swallow whole.   atorvastatin 80 MG tablet Commonly known as: LIPITOR Take 1 tablet (80 mg total) by mouth daily.   Benicar 40 MG tablet Generic drug: olmesartan Take 40 mg by mouth daily.   Fish Oil 1200 MG Caps Take 1,200 mg by mouth daily.   metoprolol tartrate 25 MG tablet Commonly known as: LOPRESSOR Take 1 tablet (25 mg total) by mouth 2 (two) times daily.   nitroGLYCERIN 0.4 MG SL tablet Commonly known as: NITROSTAT Place 1 tablet (0.4 mg total) under the tongue every 5 (five) minutes x 3 doses as needed for chest pain.   ticagrelor 90 MG Tabs tablet Commonly known as: [employer]  Take 1 tablet (90 mg total) by mouth 2 (two) times daily.         ROS:  {Review of Systems:30496}  Blood pressure (!) 190/101, pulse 63, temperature 98 F (36.7 C), temperature source Oral, resp. rate 14, height 6' (1.829 m), weight 233 lb (105.7 kg), SpO2 95 %. Physical Exam  Results: No results found for this or any previous visit (from the past 48 hour(s)).  No results found.   Assessment & Plan:  Jason [name] is a 61 y.o. male with *** -*** -*** -Follow up ***  All questions were answered to the satisfaction of the patient and family***.  The risk and benefits of *** were discussed including but not limited to ***.  After careful consideration, Jason [name] has decided to ***.    Jason [name] A  Kandee Escalante 06/28/2022, 3:24 PM   Theophilus Kinds, DO [facility] [address] [place], [place] [address] [phone] (office)

## 2022-06-29 ENCOUNTER — Telehealth: Payer: Self-pay | Admitting: *Deleted

## 2022-06-29 NOTE — Telephone Encounter (Signed)
   Pre-operative Risk Assessment    Patient Name: Jason [name]  DOB: 1961/02/06 MRN: [id number]     PT HAS APPT WITH DR. ROSS 06/30/22  Request for Surgical Clearance    Procedure:   LAP CHOLECYSTECTOMY  Date of Surgery:  Clearance TBD                                 Surgeon:  DR. Theophilus Kinds Surgeon's Group or Practice Name:  [facility]  Phone number:  [phone] Fax number:  [phone]   Type of Clearance Requested:   - Medical  - Pharmacy:  Hold Ticagrelor (Brilinta)     Type of Anesthesia:   CHOICE   Additional requests/questions:    Elpidio Anis   06/29/2022, 9:38 AM

## 2022-06-29 NOTE — Telephone Encounter (Signed)
Pt has appt with me Thursday    Can we get records re LFTs, USN.  Being consider for [employer]

## 2022-06-30 ENCOUNTER — Ambulatory Visit: Payer: BC Managed Care – PPO | Attending: Internal Medicine | Admitting: Internal Medicine

## 2022-06-30 ENCOUNTER — Encounter: Payer: Self-pay | Admitting: Internal Medicine

## 2022-06-30 VITALS — BP 154/102 | HR 53 | Ht 72.0 in | Wt 236.0 lb

## 2022-06-30 DIAGNOSIS — I2510 Atherosclerotic heart disease of native coronary artery without angina pectoris: Secondary | ICD-10-CM | POA: Diagnosis not present

## 2022-06-30 NOTE — Telephone Encounter (Signed)
Records received

## 2022-06-30 NOTE — Patient Instructions (Signed)
Medication Instructions:   *If you need a refill on your cardiac medications before your next appointment, please call your pharmacy*   Lab Work: cmet today   If you have labs (blood work) drawn today and your tests are completely normal, you will receive your results only by: MyChart Message (if you have MyChart) OR A paper copy in the mail If you have any lab test that is abnormal or we need to change your treatment, we will call you to review the results.   Testing/Procedures:    Follow-Up: At [facility], you and your health needs are our priority.  As part of our continuing mission to provide you with exceptional heart care, we have created designated Provider Care Teams.  These Care Teams include your primary Cardiologist (physician) and Advanced Practice Providers (APPs -  Physician Assistants and Nurse Practitioners) who all work together to provide you with the care you need, when you need it.  We recommend signing up for the patient portal called "MyChart".  Sign up information is provided on this After Visit Summary.  MyChart is used to connect with patients for Virtual Visits (Telemedicine).  Patients are able to view lab/test results, encounter notes, upcoming appointments, etc.  Non-urgent messages can be sent to your provider as well.   To learn more about what you can do with MyChart, go to [email or web address].     Important Information About Sugar

## 2022-07-01 LAB — COMPREHENSIVE METABOLIC PANEL
ALT: 23 IU/L (ref 0–44)
AST: 30 IU/L (ref 0–40)
Albumin/Globulin Ratio: 1.9 (ref 1.2–2.2)
Albumin: 4.4 g/dL (ref 3.8–4.9)
Alkaline Phosphatase: 102 IU/L (ref 44–121)
BUN/Creatinine Ratio: 12 (ref 9–20)
BUN: 20 mg/dL (ref 6–24)
Bilirubin Total: 1.2 mg/dL (ref 0.0–1.2)
CO2: 24 mmol/L (ref 20–29)
Calcium: 9.5 mg/dL (ref 8.7–10.2)
Chloride: 106 mmol/L (ref 96–106)
Creatinine, Ser: 1.69 mg/dL — ABNORMAL HIGH (ref 0.76–1.27)
Globulin, Total: 2.3 g/dL (ref 1.5–4.5)
Glucose: 86 mg/dL (ref 70–99)
Potassium: 5.2 mmol/L (ref 3.5–5.2)
Sodium: 142 mmol/L (ref 134–144)
Total Protein: 6.7 g/dL (ref 6.0–8.5)
eGFR: 46 mL/min/{1.73_m2} — ABNORMAL LOW (ref >59)

## 2022-07-01 IMAGING — CR DG CHEST 2V
2 series · 2 of 2 positions shown · non-contrast
Comparison: 09/03/2021 and prior radiographs

CLINICAL DATA: Acute chest pain

EXAM:
CHEST - 2 VIEW

[chest pa]
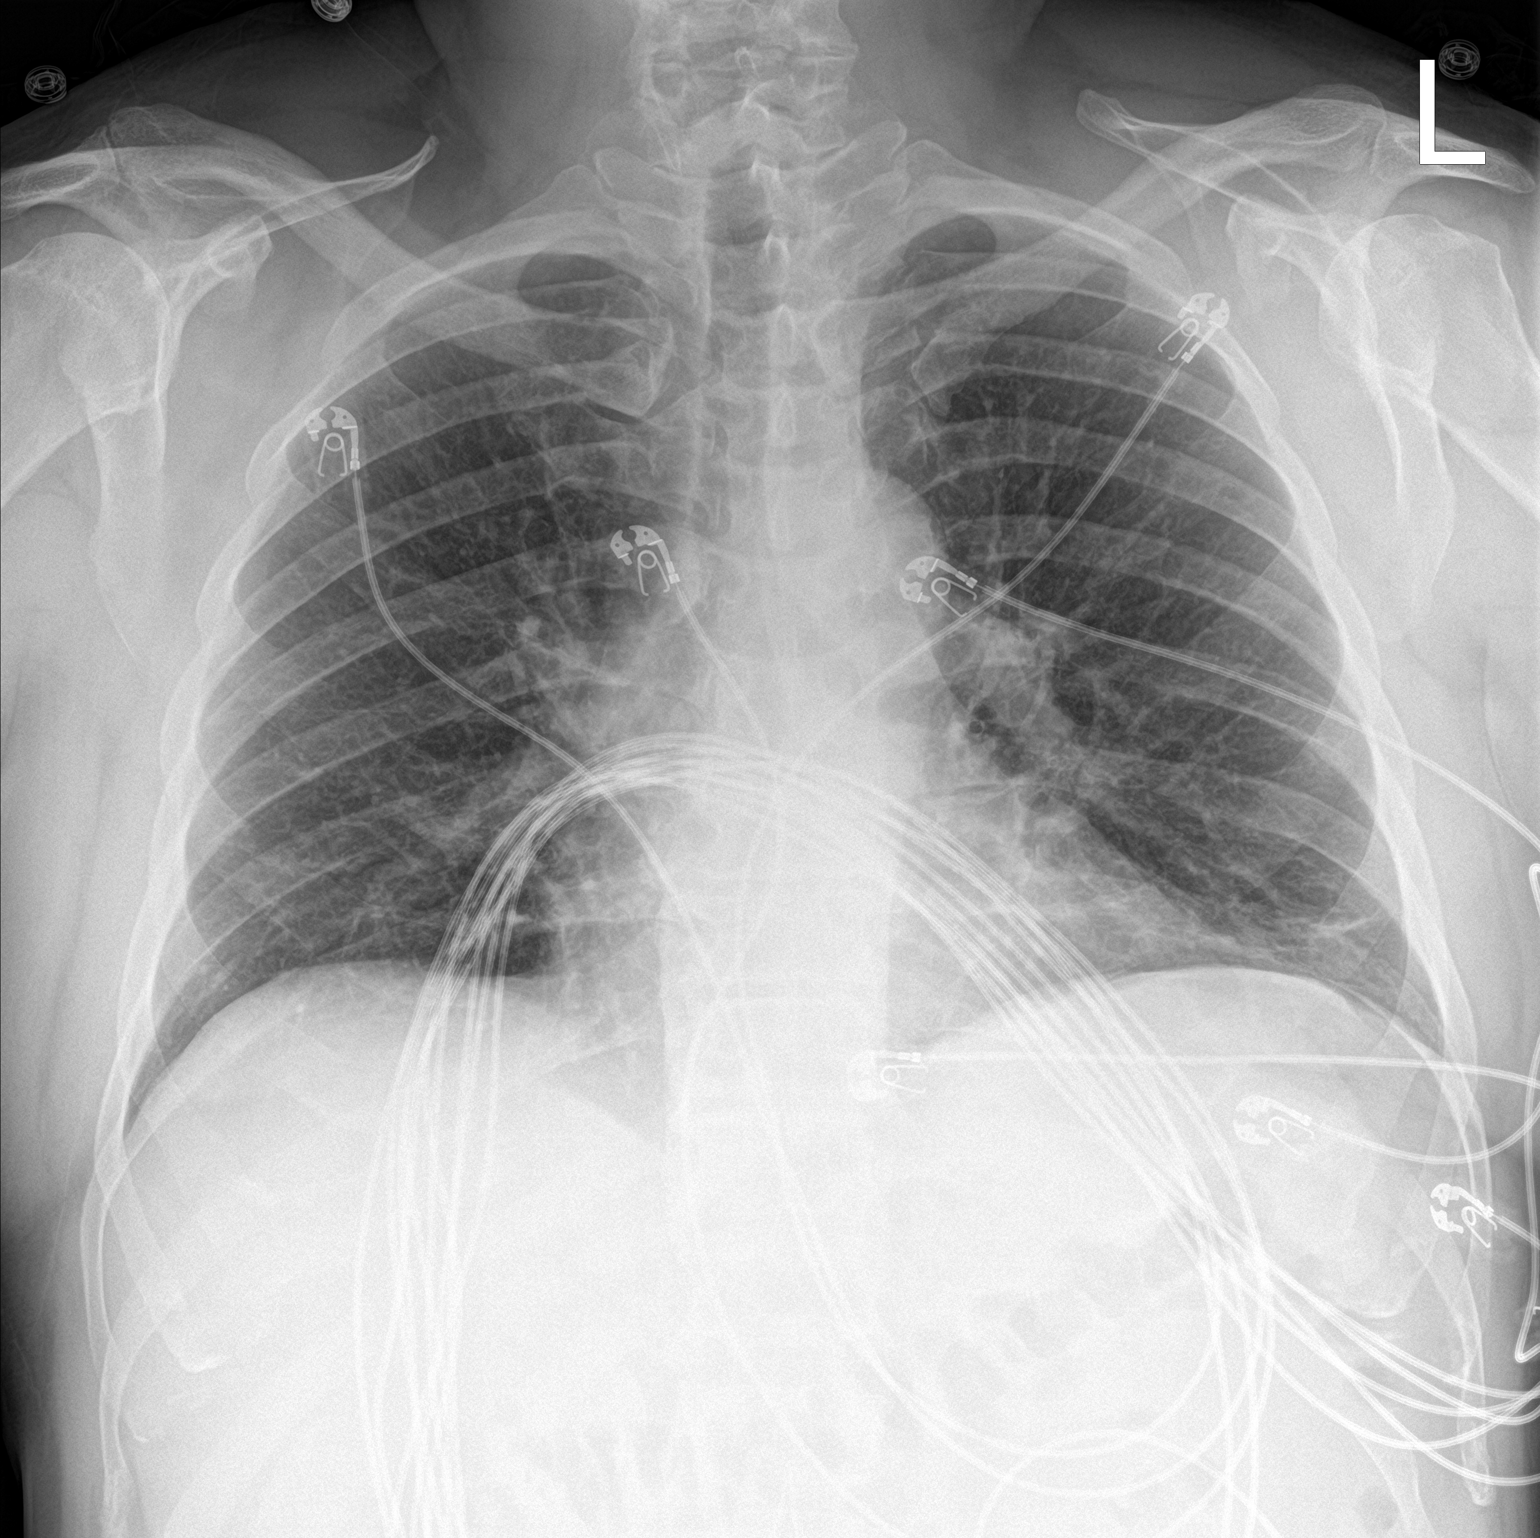

[chest lat]
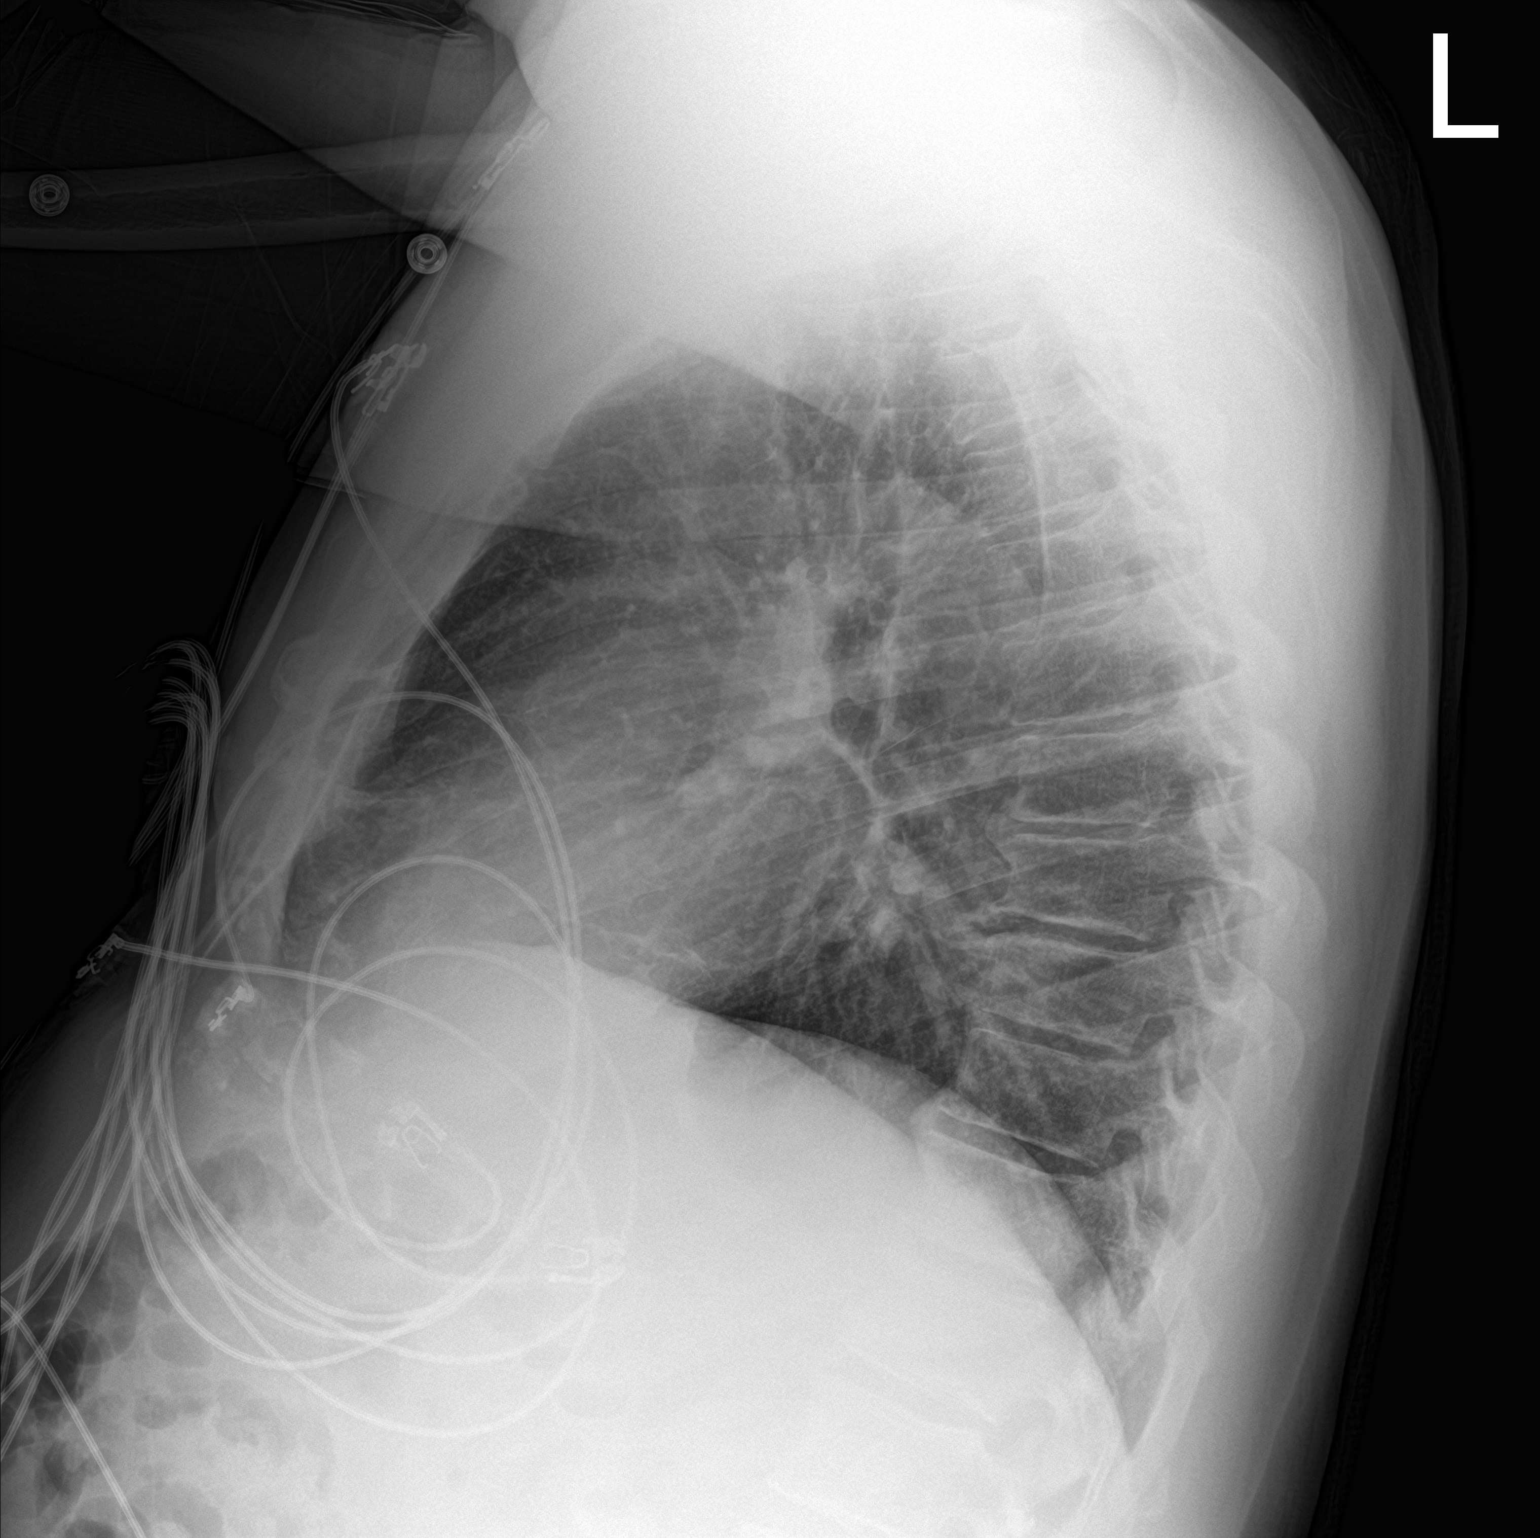

[2 of 2 positions shown; findings below may reference images not displayed]

FINDINGS: The cardiomediastinal silhouette is unremarkable.

Minimal LEFT basilar atelectasis/scarring is identified.

There is no evidence of focal airspace disease, pulmonary edema,
suspicious pulmonary nodule/mass, pleural effusion, or pneumothorax.

No acute bony abnormalities are identified.
IMPRESSION: Minimal LEFT basilar atelectasis/scarring.

## 2022-07-04 NOTE — Telephone Encounter (Signed)
Please advise on cardiac clearance.  Thank you.

## 2022-07-06 NOTE — Telephone Encounter (Signed)
I spoke to the patient today  His LFTs have normalized  He is 10 mon post MI and PTCA/STENT   With  normalization of labs I  would try to get closer to 1 year of DAPT    Patient feeling OK If closer to Feb 2024 and plans for surgery I think he is a relatively low risk for planned surgery . For now would like to continue DAPT

## 2022-07-06 NOTE — Telephone Encounter (Signed)
I will forward this to pre op to review if pt has been cleared.

## 2022-07-07 NOTE — Telephone Encounter (Signed)
   Patient Name: Jason [name]  DOB: 05-Mar-1961 MRN: [id number]  Primary Cardiologist: Dietrich Pates, MD  Chart reviewed as part of pre-operative protocol coverage.   Per Dr. Tenny Craw, primary cardiologist "I spoke to the patient today  His LFTs have normalized. 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 With  normalization of labs I  would try to get closer to 1 year of DAPT. Patient feeling OK. If closer to Feb 2024 and plans for surgery I think he is a relatively low risk for planned surgery. For now would like to continue DAPT."  Patient to continue on noninterrupted DAPT with aspirin and Brilinta through February 2024.   I will route this recommendation to the requesting party via Epic fax function and remove from pre-op pool.  Please call with questions.  Joylene Grapes, NP 07/07/2022, 9:07 AM

## 2022-07-18 ENCOUNTER — Telehealth (INDEPENDENT_AMBULATORY_CARE_PROVIDER_SITE_OTHER): Payer: BC Managed Care – PPO | Admitting: Surgery

## 2022-07-18 DIAGNOSIS — K8020 Calculus of gallbladder without cholecystitis without obstruction: Secondary | ICD-10-CM

## 2022-07-18 NOTE — Telephone Encounter (Signed)
[facility]  Spoke with the patient to see how he has been feeling since evaluation by me.  He states that he has been doing well and has had no further episodes concerning for right upper quadrant abdominal pain or gallbladder attacks.  His cardiologist expressed that they would like him to stay on dual antiplatelet therapy with Brilinta and aspirin till February 2024 (1 year from his cardiac catheterization) if possible.  He underwent repeat LFTs, which had normalized.  At this point, I think it is okay for the patient to follow-up with me as needed for any further concerns related to gallbladder disease or symptoms.  Advised him to call us if he has any change in his symptoms.  All questions were answered to his expressed satisfaction.  Theophilus Kinds, DO [facility] [address] [place]ella Raring [place], [place] [address] [phone] (office)

## 2022-08-15 ENCOUNTER — Other Ambulatory Visit: Payer: Self-pay

## 2022-08-15 MED ORDER — ATORVASTATIN CALCIUM 80 MG PO TABS: 80.0000 mg | ORAL_TABLET | Freq: Every day | ORAL | 3 refills | Status: AC

## 2022-08-15 MED ORDER — METOPROLOL TARTRATE 25 MG PO TABS: 25.0000 mg | ORAL_TABLET | Freq: Two times a day (BID) | ORAL | 3 refills | Status: AC

## 2022-09-23 ENCOUNTER — Telehealth: Payer: Self-pay | Admitting: Internal Medicine

## 2022-09-23 NOTE — Telephone Encounter (Signed)
The patient has been notified of the result and verbalized understanding.  All questions (if any) were answered. Gershon Crane, LPN [phone] PM

## 2022-09-23 NOTE — Telephone Encounter (Signed)
Pt c/o medication issue:  1. Name of Medication:   ticagrelor (BRILINTA) 90 MG TABS tablet   2. How are you currently taking this medication (dosage and times per day)?   As prescribed  3. Are you having a reaction (difficulty breathing--STAT)?   No  4. What is your medication issue?   Patient stated he was to be on this medication for a year and he is now running out of this medication.  Patient would like to know if he should stay on this medication or next steps.

## 2022-09-23 NOTE — Telephone Encounter (Signed)
In March he will be at one year from stent placement    Please make sure he has Brilinta to complete the 12 months (can do a little exrtra ) Then OK to stop   Keep on aspirin

## 2022-11-11 ENCOUNTER — Other Ambulatory Visit: Payer: Self-pay

## 2022-11-11 MED ORDER — ASPIRIN 81 MG PO TBEC: 81.0000 mg | DELAYED_RELEASE_TABLET | Freq: Every day | ORAL | 2 refills | Status: AC

## 2023-10-10 ENCOUNTER — Other Ambulatory Visit: Payer: Self-pay

## 2023-10-10 MED ORDER — NITROGLYCERIN 0.4 MG SL SUBL: 0.4000 mg | SUBLINGUAL_TABLET | SUBLINGUAL | 0 refills | Status: AC | PRN
